# Patient Record
Sex: Female | Born: 1965 | Race: White | Hispanic: No | Marital: Married | Smoking: Never smoker
Health system: Southern US, Community
[De-identification: ages and names within clinical notes are randomized; demographics above are authoritative.]

## PROBLEM LIST (undated history)

## (undated) DIAGNOSIS — F329 Major depressive disorder, single episode, unspecified: Secondary | ICD-10-CM

## (undated) DIAGNOSIS — N938 Other specified abnormal uterine and vaginal bleeding: Secondary | ICD-10-CM

## (undated) DIAGNOSIS — T7840XA Allergy, unspecified, initial encounter: Secondary | ICD-10-CM

## (undated) DIAGNOSIS — F419 Anxiety disorder, unspecified: Secondary | ICD-10-CM

## (undated) DIAGNOSIS — F32A Depression, unspecified: Secondary | ICD-10-CM

## (undated) DIAGNOSIS — E785 Hyperlipidemia, unspecified: Secondary | ICD-10-CM

## (undated) HISTORY — DX: Other specified abnormal uterine and vaginal bleeding: N93.8

## (undated) HISTORY — DX: Major depressive disorder, single episode, unspecified: F32.9

## (undated) HISTORY — PX: TUBAL LIGATION: SHX77

## (undated) HISTORY — DX: Anxiety disorder, unspecified: F41.9

## (undated) HISTORY — PX: NASAL SINUS SURGERY: SHX719

## (undated) HISTORY — PX: DILATION AND CURETTAGE OF UTERUS: SHX78

## (undated) HISTORY — DX: Hyperlipidemia, unspecified: E78.5

## (undated) HISTORY — DX: Depression, unspecified: F32.A

## (undated) HISTORY — PX: RHINOPLASTY: SUR1284

## (undated) HISTORY — DX: Allergy, unspecified, initial encounter: T78.40XA

## (undated) HISTORY — PX: HYSTEROSCOPY: SHX211

---

## 1997-05-01 ENCOUNTER — Other Ambulatory Visit: Admission: RE | Admit: 1997-05-01 | Discharge: 1997-05-01 | Payer: Self-pay | Admitting: Gynecology

## 1997-05-06 ENCOUNTER — Encounter: Admission: RE | Admit: 1997-05-06 | Discharge: 1997-08-04 | Payer: Self-pay | Admitting: Gynecology

## 1997-10-15 ENCOUNTER — Inpatient Hospital Stay (HOSPITAL_COMMUNITY): Admission: AD | Admit: 1997-10-15 | Discharge: 1997-10-15 | Payer: Self-pay | Admitting: Gynecology

## 1997-10-28 ENCOUNTER — Inpatient Hospital Stay (HOSPITAL_COMMUNITY): Admission: AD | Admit: 1997-10-28 | Discharge: 1997-10-28 | Payer: Self-pay | Admitting: Gynecology

## 1997-10-29 ENCOUNTER — Inpatient Hospital Stay (HOSPITAL_COMMUNITY): Admission: AD | Admit: 1997-10-29 | Discharge: 1997-10-29 | Payer: Self-pay | Admitting: Obstetrics and Gynecology

## 1997-10-30 ENCOUNTER — Inpatient Hospital Stay (HOSPITAL_COMMUNITY): Admission: AD | Admit: 1997-10-30 | Discharge: 1997-10-30 | Payer: Self-pay | Admitting: Gynecology

## 1997-11-06 ENCOUNTER — Observation Stay (HOSPITAL_COMMUNITY): Admission: AD | Admit: 1997-11-06 | Discharge: 1997-11-07 | Payer: Self-pay | Admitting: Gynecology

## 1997-11-06 ENCOUNTER — Encounter: Payer: Self-pay | Admitting: Gynecology

## 1997-11-08 ENCOUNTER — Inpatient Hospital Stay (HOSPITAL_COMMUNITY): Admission: AD | Admit: 1997-11-08 | Discharge: 1997-11-08 | Payer: Self-pay | Admitting: Obstetrics and Gynecology

## 1997-11-10 ENCOUNTER — Inpatient Hospital Stay (HOSPITAL_COMMUNITY): Admission: AD | Admit: 1997-11-10 | Discharge: 1997-11-10 | Payer: Self-pay | Admitting: Obstetrics and Gynecology

## 1997-11-29 ENCOUNTER — Inpatient Hospital Stay (HOSPITAL_COMMUNITY): Admission: AD | Admit: 1997-11-29 | Discharge: 1997-11-29 | Payer: Self-pay | Admitting: Obstetrics and Gynecology

## 1997-12-03 ENCOUNTER — Inpatient Hospital Stay (HOSPITAL_COMMUNITY): Admission: AD | Admit: 1997-12-03 | Discharge: 1997-12-06 | Payer: Self-pay | Admitting: Gynecology

## 1998-01-05 ENCOUNTER — Other Ambulatory Visit: Admission: RE | Admit: 1998-01-05 | Discharge: 1998-01-05 | Payer: Self-pay | Admitting: Gynecology

## 2002-12-30 ENCOUNTER — Other Ambulatory Visit: Admission: RE | Admit: 2002-12-30 | Discharge: 2002-12-30 | Payer: Self-pay | Admitting: Gynecology

## 2002-12-31 ENCOUNTER — Encounter: Admission: RE | Admit: 2002-12-31 | Discharge: 2002-12-31 | Payer: Self-pay | Admitting: Gynecology

## 2005-08-22 ENCOUNTER — Other Ambulatory Visit: Admission: RE | Admit: 2005-08-22 | Discharge: 2005-08-22 | Payer: Self-pay | Admitting: Gynecology

## 2005-10-16 ENCOUNTER — Ambulatory Visit (HOSPITAL_COMMUNITY): Admission: RE | Admit: 2005-10-16 | Discharge: 2005-10-16 | Payer: Self-pay | Admitting: Gynecology

## 2005-10-16 ENCOUNTER — Encounter (INDEPENDENT_AMBULATORY_CARE_PROVIDER_SITE_OTHER): Payer: Self-pay | Admitting: *Deleted

## 2006-01-23 HISTORY — PX: ABDOMINAL HYSTERECTOMY: SHX81

## 2006-10-08 ENCOUNTER — Other Ambulatory Visit: Admission: RE | Admit: 2006-10-08 | Discharge: 2006-10-08 | Payer: Self-pay | Admitting: Gynecology

## 2006-10-11 ENCOUNTER — Inpatient Hospital Stay (HOSPITAL_COMMUNITY): Admission: AD | Admit: 2006-10-11 | Discharge: 2006-10-14 | Payer: Self-pay | Admitting: Gynecology

## 2006-10-11 ENCOUNTER — Other Ambulatory Visit: Payer: Self-pay | Admitting: Gynecology

## 2006-10-11 ENCOUNTER — Encounter: Payer: Self-pay | Admitting: Gynecology

## 2010-06-07 NOTE — H&P (Signed)
NAMESHIRON, WHETSEL               ACCOUNT NO.:  [id number]   MEDICAL RECORD NO.:  [id number]        PATIENT TYPE:  HAMB   LOCATION:                               FACILITY:  [facility]   PHYSICIAN:  Juan H. Lily Peer, M.D.DATE OF BIRTH:  04-18-1965   DATE OF ADMISSION:  10/11/2006  DATE OF DISCHARGE:                              HISTORY & PHYSICAL   Patient is scheduled for surgery Thursday, September 18, at 7:30 a.m. in  [facility].   CHIEF COMPLAINT:  Dysfunctional uterine bleeding and pelvic pain and  fibroid uterus.   HISTORY:  The patient was seen in the office for a preoperative  consultation and overdue gynecological examination on October 08, 2006.  She is a 45 year old gravida 2, para 2, who had been complaining  of menometrorrhagia, tiredness and fatigue.  She had been seen in the  office on August 29, 2006, as well in August __________  2008,  respectively.  In the past, because of menorrhagia, she has had NovaSure  endometrial ablation.  As part of her recent workup, a TSH and prolactin  have been drawn because of her dysfunctional uterine bleeding.  Her  prolactin level was elevated at 40.8, although she denied any nipple  discharge, visual disturbances or any unusual headache.  Her CBC was  otherwise normal.  We attempted to do a sonohysterogram, but had  difficulty passing the catheter in the upper portion of the uterine  cavity.  A band or synechia was evident.  She did have a small fibroid,  measuring 16 by 11 mm, and the ovaries appeared to be normal.  Because  of her chronic nature of bloating sensation and pelvic pain, especially  before her menses, she is scheduled to undergo laparoscopically-assisted  vaginal hysterectomy with ovarian conservation.   PAST MEDICAL HISTORY:  Patient denies any allergies.  She has had two  previous cesarean sections.   MEDICATIONS:  1. She takes Excedrin p.r.n.  2. She takes Parlodel 2.5 mg twice daily for  recently diagnosed      hyperprolactinemia.  Recent prolactin level pending at time of this      dictation.  3. She also takes Wellbutrin and Effexor for depression and anxiety.   PAST SURGICAL HISTORY:  In addition to the two cesarean sections, she  has had history of rhinoplasty and tubal ligation.  She also has had a  NovaSure endometrial ablation and D and C and hysteroscopy in September  2007.   FAMILY HISTORY:  Aunt with diabetes.  Maternal grandmother and aunt with  hypertension and grandparents and uncle with cardiovascular disease.   PHYSICAL EXAMINATION:  The patient weighs approximately 139 pounds.  HEENT:  Unremarkable.  NECK:  Supple, trachea midline.  No carotid bruits, no thyromegaly.  LUNGS:  Clear to auscultation without any rhonchi or wheezes.  HEART:  Regular rate and rhythm, no murmurs or gallops.  BREAST EXAM:  Both breasts are symmetrical in appearance.  No skin  discoloration.  No nipple inversion.  No palpable masses or tenderness.  No supraclavicular or axillary lymphadenopathy.  ABDOMEN:  Soft and  nontender, no rebound or guarding.  Pfannenstiel scar  was evident.  PELVIC EXAM:  Bartholin, urethra, Skene glands were within normal  limits.  Cervix appeared to be somewhat short.  Bimanual examination  demonstrated anteverted uterus, no palpable adnexal masses or  tenderness.  RECTAL EXAM:  Deferred.   ASSESSMENT:  Patient is a 45 year old gravida 2, para 2, with  hyperprolactinemia on Parlodel 2.5 mg daily.  Recent prolactin level  pending.  Patient is scheduled for laparoscopically-assisted vaginal  hysterectomy with ovarian conservation October 11, 2006, at [facility] as a result of dysfunctional uterine bleeding, low  pelvic pain and small fibroid uterus.  The patient's Pap smear is  pending at time of this dictation.  Patient was counseled that potential  risks of the operation are as follows.  She is at increased risk for  deep  venous thrombosis and pulmonary embolism.  She will have PSA  stockings for prophylaxis.  Also, for infection she will receive  intravenous antibiotics for prophylaxis, as well.  In the event that  patient would have any uncontrollable hemorrhage, she is fully aware of  potential complications from blood transfusion and donor-to-recipient,  such as anaphylactic reaction, hepatitis and AIDS.  In the event of any  technical difficulty, proceeding with the operation laparoscopically, or  any internal bleeding or injury, the scope will be pulled out and open  laparotomy technique may need to be performed in an effort to complete  the operation.  The goal is to leave both ovaries, unless gross  pathology deems necessary to be removed and, if so, patient would need  to be on hormone replacement therapy.  All these issues were discussed  with the patient, as well as potential risk or trauma to the bladder,  intestines or other internal structures.  All questions were answered  and will follow accordingly.   PLAN:  Patient is scheduled for laparoscopically-assisted vaginal  hysterectomy with ovarian conservation Thursday, September 18, at 7:30  a.m. in [facility].      Juan H. Lily Peer, M.D.  Electronically Signed     JHF/MEDQ  D:  10/10/2006  T:  10/10/2006  Job:  [id number]

## 2010-06-07 NOTE — Discharge Summary (Signed)
Pamela Forbes, Pamela Forbes               ACCOUNT NO.:  [id number]   MEDICAL RECORD NO.:  [id number]          PATIENT TYPE:  INP   LOCATION:  1528                         FACILITY:  [facility]   PHYSICIAN:  Juan H. Lily Peer, M.D.DATE OF BIRTH:  03/07/1965   DATE OF ADMISSION:  10/11/2006  DATE OF DISCHARGE:  10/14/2006                               DISCHARGE SUMMARY   TOTAL DAYS HOSPITALIZED:  Three days.   HISTORY:  The patient is a 45 year old gravida 2, para 2 with  dysfunctional uterine bleeding, fibroid uterus.  The patient was also  complaining of dysmenorrhea and chronic pelvic pain.  She was taken to  the operating room on the morning of October 11, 2006 where she  underwent attempted diagnostic laparoscopy with a plan of proceeding  with laparoscopic-assisted vaginal hysterectomy.  Due to the fact the  patient had 2 previous cesarean sections and there was extensive amount  of adhesions in the omentum with poor visualization of the uterus, the  operation was not unable to be proceeded with the laparoscopic approach,  so she ended up with an exploratory laparotomy with lysis of extensive  abdominopelvic adhesions, partial omentectomy, and TAH-RSO, whereby she  was found to have a right adnexal mass.  The right ovary had been  adhered to the posterior uterine wall, and the left ovary and tube were  normal with the exception that she had a prior tubal sterilization which  adhered to the left pelvic sidewall and hemosiderin deposits on the  peritoneal surface underlying the right ovarian mass suspicious for  endometrioma/endometriosis.  The patient had 300 mL of blood loss from  her surgery.   On her first operative day, her blood pressure was 136/98 and her T-max  was 97.  She had greater than 100 mL/hour urine output.  Her Foley  catheter and PCA pump were discontinued.  She was started on liquid diet  and p.o. pain meds and was started to ambulate.  The following morning,  the  patient had been up and ambulating, but had not passed gas.  There  was a mild soft distention and hypoactive bowel sounds, so I came by and  saw her that evening, and she had had 2 loose stools during the day.  Her abdomen was soft, but had not passed any further gas.  This morning,  on rounds, she remained afebrile.  Her abdomen was soft, nontender.  She  was starting to tolerate her regular diet.  Good bowel sounds in all  quadrants were noted, and she was ready to be discharged home.   FINAL DIAGNOSES:  1. Dysmenorrhea.  2. Menorrhagia.  3. Fibroid uterus.  4. Right adnexal mass suspicious for endometrioma.  5. Endometriosis.  6. Abdominopelvic adhesions.   PROCEDURE PERFORMED:  1. Attempted laparoscopic-assisted vaginal hysterectomy.  2. Exploratory laparotomy.  3. Extensive abdominopelvic adhesiolysis.  4. Total abdominal hysterectomy with right salpingo-oophorectomy.   FINAL DISPOSITION/FOLLOWUP:  The patient was discharged home on her  third postoperative day.  She was to receive a Dulcolax suppository  before leaving the hospital and was instructed to take magnesium citrate  this evening and also to start with MiraLax 1 tablespoon with juice  every morning for her regularity due to the fact that she has bowel  sounds on every 3-4 days which is considered a normal pattern for her.  She will return back to the office this Tuesday to have her staples  removed.  She was given a prescription for Motrin 800 mg to take one  p.o. t.i.d. p.r.n. and Lortab 5/500 to take only if in pain.  She was  instructed to ambulate.  Signs and symptoms to look out for that needs  attention before her postop visit would be fever, abdominal distention,  no passing of gas, and vomiting.  To contact the office or report to the  emergency room.  If not, we will see her on Tuesday.  Pathology report  is pending.      Juan H. Lily Peer, M.D.  Electronically Signed     JHF/MEDQ  D:  10/14/2006   T:  10/15/2006  Job:  [id number]

## 2010-06-07 NOTE — Op Note (Signed)
NAMEMARYLU, DUDENHOEFFER               ACCOUNT NO.:  [id number]   MEDICAL RECORD NO.:  [id number]          PATIENT TYPE:  AMB   LOCATION:  [facility]                         FACILITY:  [facility]   PHYSICIAN:  Juan H. Lily Peer, M.D.DATE OF BIRTH:  [birth year]   DATE OF PROCEDURE:  10/11/2006  DATE OF DISCHARGE:                               OPERATIVE REPORT   SURGEON:  Ok Edwards, M.D.   FIRST ASSISTANT:  Daniel L. Eda Paschal, M.D.   INDICATIONS FOR OPERATION:  A 45 year old gravida 2, para 2 with  menometrorrhagia, tiredness, fatigue, leiomyomatous uteri, and pelvic  pain.   PREOPERATIVE DIAGNOSES:  1. Dysmenorrhea.  2. Menometrorrhagia.  3. Pelvic pain.  4. Fibroids.   POSTOPERATIVE DIAGNOSES:  1. Dysmenorrhea.  2. Menorrhagia.  3. Chronic pelvic pain.  4. Fibroids.  5. Abdominal pelvic adhesions.  6. Right adnexal mass/ovarian.   ANESTHESIA:  General endotracheal anesthesia.   PROCEDURES PERFORMED:  1. Laparoscopy.  2. Laparotomy.  3. Lysis of abdominal pelvic adhesions.  4. Partial omentectomy.  5. Total abdominal hysterectomy with right salpingo-oophorectomy.   FINDINGS:  1. Extensive abdominal pelvic adhesions.  Omentum adhered to the      anterior abdominal wall.  2. A 3 x 4 cm right adnexal mass adhered to the posterior aspect of      the uterus with hemosiderin deposits on the peritoneum suspicious      for endometrioma, contralateral ovary normal with evidence of      previous tubal sterilization procedure, and the left ovary was      found to be also adhered to left pelvic sidewall, but was normal in      appearance.   DESCRIPTION OF OPERATION:  After the patient was adequately counseled,  she was taken to the operating room where she underwent successful  general endotracheal anesthesia.  She received a gram of cefoxitin  prophylactically and she had also had PAS stockings for DVT prophylaxis.  She was placed in the low lithotomy position.  The abdomen,  vagina, and  perineum were prepped and draped in usual sterile fashion.  Bimanual  examination demonstrated anteverted uterus with questionable fullness in  the right adnexa.  Hulka tenaculum was placed and a Foley catheter also  had been placed as well to monitor urinary output.  After the drapes  were in place, a small stab incision was made under the umbilicus  followed by insertion of the OptiVu trocar under laparoscopic guidance.  Once the peritoneum was entered, the pneumo insufflation was started.  Approximately 3.5 liters of carbon dioxide were insufflated into the  abdominal cavity.  Two additional 5-mm port were made in the lower  abdomen on the right and the left under laparoscopic guidance.  It was  noted at this time that extensive amount of omentum was adhered to the  anterior abdominal wall and lower abdomen as well making it difficult to  visualize completely the uterus, tubes, and ovary.  At this time, it was  noted that there was right adnexal mass adhered to the posterior uterus.  It was evident that the operation was not  able to completed  laparoscopically, so the instruments were removed in preparation for an  abdominal exploration.  The midline umbilicus fascia was closed with a  figure-of-eight of 0 Vicryl suture and subcutaneous tissue was  reapproximated with 3-0 Vicryl suture.  The left 5-mm port site was  closed with a staple and the right with 3-0 Vicryl suture interrupted  due to the fact that there was some mild oozing from the right port.   After this, a Pfannenstiel skin incision was made adjacent to the  previous Pfannenstiel scar from her previous C-section.  Incision was  carried down from the skin, subcutaneous tissue down to the rectus  fascia where a midline nick was made.  The fascia was incised in  transverse fashion.  The peritoneal cavity was entered cautiously.  Meticulous dissection was required due to the fact that omentum was  adhered to the  undersurface of the peritoneum and a partial omentectomy  was accomplished by removing segments of the omentum and free tie.  After excising it, the remaining segments of omentum were secured with 0  Vicryl suture.  The O'Connor-O'Sullivan retractors were then placed.  The patient was placed in steep Trendelenburg position and it was noted  that the right ovary measured approximately 4.5 cm in size, was adhered  to the posterior uterine surface and hemosiderin deposits were noted,  probably an endometrioma.  The left ovary and tube were slightly adhered  to the pelvic sidewall, but were able to be meticulously freed after  dissection.  Two Heaney clamps were placed adjacent to the right and  left utero-ovarian ligaments.  The ureters were identified.  The  posterior broad ligament was penetrated with a surgeon's finger and the  right infundibulopelvic ligament was clamped, cut, and free tied with 0  Vicryl suture followed by transfixion stitch.  The round ligament had  previously been transected and the bladder was peeled down to the level  of the lower uterine segment.  With the use of straight Heaney, the  parametrial tissue to include the cardinal and subsequently the broad  ligaments were clamped, cut, and suture ligated with 0 Vicryl suture to  the level of right vaginal fornix.  On the left side, the ovary appeared  normal.  The round ligament was transected and a Heaney clamp was  utilized to penetrate the broad ligament and the Heaney clamp was placed  close to the uterus and the utero-ovarian ligament was transected and  the pedicle was free tied with 0 Vicryl suture followed by transfixion  stitch, thus leaving the left tube and ovary behind.  The remainder of  the parametrium to include the cardinal and broad ligament were clamped,  cut, and suture ligated with 0 Vicryl suture to the level of the left  vaginal fornix.  With curved Heaney, the cervicovaginal region was  clamped,  cut, and the cervix and uterus were passed off the operative  field with the right tube and ovary.  The angles were secured with 0  Vicryl suture and the remaining cuff was secured with interrupted  sutures of 0 Vicryl suture.  Systematic inspection of the entire pelvic  cavity containing some of the small oozing was contained with the Bovie.  After copious irrigation, the vaginal cuff was inspected.  No active  bleeding.  Surgicel was placed for additional hemostasis.  Inspection of  the areas with a previous omentectomy demonstrated there was complete  dryness.  No other bleeding was noted as both  pedicles looked dry.  The  sponge count and needle count were correct.  The O'Connor-O'Sullivan  retractor was removed.  The visceral peritoneum was not reapproximated,  but the rectus fascia was closed with running stitch of 0 Vicryl suture.  The subcutaneous bleeder were Bovie cauterized.  The skin was  reapproximated with skin clips followed by placing Xeroform gauze and 4x  dressing.  A  0.25% Marcaine was infiltrated in all port sites and Xeroform gauze and  4x dressing was then placed.  The patient was awakened and transferred  to recovery room with stable vital signs.  Blood loss from procedure was  300 mL.  IV fluids 2300 mL of lactated Ringer's and urine output 150 mL  and clear.      Juan H. Lily Peer, M.D.  Electronically Signed     JHF/MEDQ  D:  10/11/2006  T:  10/11/2006  Job:  [id number]

## 2010-06-10 NOTE — Op Note (Signed)
[name], LIST               ACCOUNT NO.:  [id number]   MEDICAL RECORD NO.:  [id number]          PATIENT TYPE:  AMB   LOCATION:  [facility]                           FACILITY:  [facility]   PHYSICIAN:  Ivor Costa. Farrel Gobble, M.D. DATE OF BIRTH:  1965/12/25   DATE OF PROCEDURE:  10/16/2005  DATE OF DISCHARGE:  10/16/2005                                 OPERATIVE REPORT   PREOPERATIVE DIAGNOSES:  Dysfunctional bleeding with intrauterine device.   POSTOPERATIVE DIAGNOSES:  Dysfunctional bleeding with intrauterine device.   PROCEDURE:  Dilatation and curettage, hysteroscopy, NovaSure endometrial  ablation, intrauterine device removal.   SURGEON:  Ivor Costa. Farrel Gobble, M.D.   ANESTHESIA:  MAC with paracervical block, 10 mL of 0.25% Marcaine.   FLUIDS:  I&O deficit of lactated Ringer's was 100 mL.   ESTIMATED BLOOD LOSS:  Minimal.   FINDINGS:  Anteflexed uterus sounding to 8 cm.  The cervix was four.  The  width was 2.2, smooth cavity, normal ostia.   PROCEDURE:  The patient was taken to the operating room.  General anesthesia  was induced, placed in dorsal lithotomy position, prepped and draped in the  usual sterile fashion.  A bimanual exam was performed.  Normal orientation  of the uterus was confirmed.  A sterile weighted speculum was placed in the  vagina.  The cervix was markedly on the anterior vaginal wall however the  IUD strings were able to be seen and grasped and removed.  The cervix was  sounded repeatedly at 4 cm.  The orientation of the uterus made it difficult  to sound the cavity length.  The speculum was then removed.  The cervical  length was confirmed at four and the uterus sounded to 8.  Diagnostic  hysteroscope was then advanced through the cervix towards the fundus.  The  cavity was smooth without any defects.  The ostia were visualized.  The  cavity was noted to be narrow.  Gentle curettage was then performed.  The  NovaSure was set to the specifications and advanced through  the cervix.  The  cervix did require dilation to 21-French after which point the NovaSure was  felt to slide through the cervical os and into the uterine cavity.  It was  opened and despite the usual maneuvers the cavity width remained at 2.2.  The NovaSure was removed and the cavity length was confirmed against the  button as being 8 cm.  The NovaSure was placed back into the cavity.  The  fundus was tapped and again going through the usual maneuvers the cavity  failed to open greater than 2.2.  The NovaSure again was removed.  The  length of the cavity sound was confirmed.  The diagnostic hysteroscope was  advanced through the cervix towards the fundus which did show again  confirmation that the cavity was intact.  The NovaSure again was placed into  the cavity, opened up again only to 2.2, again going through the usual  maneuvers.  We elected at this point as we felt comfortable that we were  against the fundus to proceed with the ablation.  The  CO2 test was passed.  The wattage was 48 and the ablation occurred in a time of 1 minute 48  seconds.  The  hysteroscope was then advanced back through the cervix towards the fundus  and the ablated versus non-ablated tissue was clearly identified.  The  patient tolerated procedure well.  Sponge, lap and needle counts correct x2.  She was transferred to the PACU in stable condition.      Ivor Costa. Farrel Gobble, M.D.  Electronically Signed     THL/MEDQ  D:  10/16/2005  T:  10/18/2005  Job:  [id number]

## 2010-11-03 LAB — CBC
HCT: 29.4 — ABNORMAL LOW
Hemoglobin: 10.3 — ABNORMAL LOW
MCHC: 34.8
MCV: 83.2
Platelets: 322
RBC: 3.54 — ABNORMAL LOW
RDW: 13.5
WBC: 14.9 — ABNORMAL HIGH

## 2010-11-03 LAB — HEMOGLOBIN AND HEMATOCRIT, BLOOD
HCT: 32.8 — ABNORMAL LOW
Hemoglobin: 11.5 — ABNORMAL LOW

## 2010-11-10 ENCOUNTER — Encounter: Payer: Self-pay | Admitting: Gynecology

## 2010-11-11 ENCOUNTER — Ambulatory Visit (INDEPENDENT_AMBULATORY_CARE_PROVIDER_SITE_OTHER): Payer: BC Managed Care – PPO | Admitting: Gynecology

## 2010-11-11 ENCOUNTER — Encounter: Payer: Self-pay | Admitting: Gynecology

## 2010-11-11 VITALS — BP 116/70 | Ht 63.75 in | Wt 158.0 lb

## 2010-11-11 DIAGNOSIS — N949 Unspecified condition associated with female genital organs and menstrual cycle: Secondary | ICD-10-CM

## 2010-11-11 DIAGNOSIS — N951 Menopausal and female climacteric states: Secondary | ICD-10-CM

## 2010-11-11 DIAGNOSIS — R102 Pelvic and perineal pain: Secondary | ICD-10-CM

## 2010-11-11 DIAGNOSIS — R823 Hemoglobinuria: Secondary | ICD-10-CM

## 2010-11-11 MED ORDER — IBUPROFEN 800 MG PO TABS: 800.0000 mg | ORAL_TABLET | Freq: Three times a day (TID) | ORAL | Status: AC | PRN

## 2010-11-11 NOTE — Progress Notes (Signed)
Patient presents having not been seen in the office for number of years.  History of total abdominal hysterectomy right salpingo-oophorectomy for dysfunctional bleeding leiomyoma in 2008. She has a last several months some hot flushes night sweats and most acutely over the last 2 days left lower quadrant pain. It was acute onset during the evening was associated with sweating has somewhat eased off to a dull ache at present. She had no nausea vomiting diarrhea constipation no dysuria frequency or other UTI symptoms.  No fever chills or other constitutional symptoms.  No history of similar pain before.  Exam Spine straight with no CVA tenderness Abdomen soft mild left lower quadrant discomfort no rebound guarding masses or organomegaly Pelvic external BUS vagina normal, bimanual without masses or tenderness, rectovaginal exam is normal  Assessment and plan: 1. Hot flushes night sweats of the last several months. We'll check baseline TSH FSH and will follow up her results. 2. Left pelvic pain. Relatively acute in onset now better but still present. Discussed differential with the patient to include ovarian process such as cyst, possible adhesive disease noting that during her surgery there was an fair amount of abdominal adhesions, GI such as diverticulitis, urinary such as stone. History and exam points to most likely to an ovarian cyst that probably has ruptured and is resolving. We'll start with CBC for baseline white count, urinalysis rule out UTI check for blood to suggest stone. We'll schedule ultrasound rule out ovarian process.  I prescribed ibuprofen 800 mg #30 one by mouth every 8 hour when necessary pain.   Patient will follow for results and we'll go from there. She has not had a checkup and a number of years and I stressed the importance of to do so. She also notes she is way overdue for mammogram and knows that she needs to schedule this also.

## 2010-11-18 ENCOUNTER — Ambulatory Visit (INDEPENDENT_AMBULATORY_CARE_PROVIDER_SITE_OTHER): Payer: BC Managed Care – PPO | Admitting: Gynecology

## 2010-11-18 ENCOUNTER — Ambulatory Visit (INDEPENDENT_AMBULATORY_CARE_PROVIDER_SITE_OTHER): Payer: BC Managed Care – PPO

## 2010-11-18 ENCOUNTER — Encounter: Payer: Self-pay | Admitting: Gynecology

## 2010-11-18 VITALS — BP 122/74

## 2010-11-18 DIAGNOSIS — R102 Pelvic and perineal pain: Secondary | ICD-10-CM

## 2010-11-18 DIAGNOSIS — N949 Unspecified condition associated with female genital organs and menstrual cycle: Secondary | ICD-10-CM

## 2010-11-18 NOTE — Progress Notes (Signed)
Patient follows up for her ultrasound due to pelvic pain. Her lab work showed normal TSH FSH normal white count negative urine culture. Ultrasound today is normal showing left ovary with physiologic changes. Her right adnexa is negative she is status post RSO. She notes that her pain is better although she is still having some bloating symptoms and some diarrhea. I reviewed with her I think the etiology of her pain and symptoms is GI such as irritable bowel / spastic colon. I doubt diverticulitis given her young age and normal white count. Patient will increase fiber in her diet monitor her symptoms over the next week if her diarrhea clears and her abdominal bloating resolves and will follow with her abdominal symptoms continue she is to call me and we'll arrange for her to be seen by gastroenterologist.

## 2012-08-18 ENCOUNTER — Emergency Department (HOSPITAL_COMMUNITY)
Admission: EM | Admit: 2012-08-18 | Discharge: 2012-08-18 | Disposition: A | Discharge status: Home / Self Care | Payer: BC Managed Care – PPO | Attending: Emergency Medicine | Admitting: Emergency Medicine

## 2012-08-18 ENCOUNTER — Encounter (HOSPITAL_COMMUNITY): Payer: Self-pay | Admitting: *Deleted

## 2012-08-18 ENCOUNTER — Ambulatory Visit (INDEPENDENT_AMBULATORY_CARE_PROVIDER_SITE_OTHER): Payer: BC Managed Care – PPO | Admitting: Emergency Medicine

## 2012-08-18 ENCOUNTER — Emergency Department (HOSPITAL_COMMUNITY): Payer: BC Managed Care – PPO

## 2012-08-18 VITALS — BP 130/74 | HR 74 | Temp 98.2°F | Resp 16 | Ht 64.0 in | Wt 159.2 lb

## 2012-08-18 DIAGNOSIS — R110 Nausea: Secondary | ICD-10-CM | POA: Insufficient documentation

## 2012-08-18 DIAGNOSIS — R200 Anesthesia of skin: Secondary | ICD-10-CM

## 2012-08-18 DIAGNOSIS — F3289 Other specified depressive episodes: Secondary | ICD-10-CM | POA: Insufficient documentation

## 2012-08-18 DIAGNOSIS — R209 Unspecified disturbances of skin sensation: Secondary | ICD-10-CM | POA: Insufficient documentation

## 2012-08-18 DIAGNOSIS — Z79899 Other long term (current) drug therapy: Secondary | ICD-10-CM | POA: Insufficient documentation

## 2012-08-18 DIAGNOSIS — F329 Major depressive disorder, single episode, unspecified: Secondary | ICD-10-CM | POA: Insufficient documentation

## 2012-08-18 DIAGNOSIS — Z8742 Personal history of other diseases of the female genital tract: Secondary | ICD-10-CM | POA: Insufficient documentation

## 2012-08-18 DIAGNOSIS — R202 Paresthesia of skin: Secondary | ICD-10-CM

## 2012-08-18 DIAGNOSIS — M79609 Pain in unspecified limb: Secondary | ICD-10-CM | POA: Insufficient documentation

## 2012-08-18 DIAGNOSIS — R079 Chest pain, unspecified: Secondary | ICD-10-CM

## 2012-08-18 DIAGNOSIS — F411 Generalized anxiety disorder: Secondary | ICD-10-CM | POA: Insufficient documentation

## 2012-08-18 DIAGNOSIS — R0789 Other chest pain: Secondary | ICD-10-CM | POA: Insufficient documentation

## 2012-08-18 DIAGNOSIS — Z8589 Personal history of malignant neoplasm of other organs and systems: Secondary | ICD-10-CM | POA: Insufficient documentation

## 2012-08-18 DIAGNOSIS — R5381 Other malaise: Secondary | ICD-10-CM | POA: Insufficient documentation

## 2012-08-18 DIAGNOSIS — R0602 Shortness of breath: Secondary | ICD-10-CM | POA: Insufficient documentation

## 2012-08-18 LAB — CBC WITH DIFFERENTIAL/PLATELET
Basophils Absolute: 0 10*3/uL (ref 0.0–0.1)
Basophils Relative: 0 % (ref 0–1)
Eosinophils Absolute: 0.1 10*3/uL (ref 0.0–0.7)
Eosinophils Relative: 1 % (ref 0–5)
HCT: 40.3 % (ref 36.0–46.0)
Hemoglobin: 13.3 g/dL (ref 12.0–15.0)
Lymphocytes Relative: 32 % (ref 12–46)
Lymphs Abs: 3.1 10*3/uL (ref 0.7–4.0)
MCH: 28.2 pg (ref 26.0–34.0)
MCHC: 33 g/dL (ref 30.0–36.0)
MCV: 85.6 fL (ref 78.0–100.0)
Monocytes Absolute: 0.6 10*3/uL (ref 0.1–1.0)
Monocytes Relative: 6 % (ref 3–12)
Neutro Abs: 6 10*3/uL (ref 1.7–7.7)
Neutrophils Relative %: 61 % (ref 43–77)
Platelets: 344 10*3/uL (ref 150–400)
RBC: 4.71 MIL/uL (ref 3.87–5.11)
RDW: 13.7 % (ref 11.5–15.5)
WBC: 9.8 10*3/uL (ref 4.0–10.5)

## 2012-08-18 LAB — D-DIMER, QUANTITATIVE: D-Dimer, Quant: <0.27 ug/mL-FEU (ref 0.00–0.48)

## 2012-08-18 LAB — BASIC METABOLIC PANEL
BUN: 14 mg/dL (ref 6–23)
CO2: 23 mEq/L (ref 19–32)
Calcium: 9.1 mg/dL (ref 8.4–10.5)
Chloride: 103 mEq/L (ref 96–112)
Creatinine, Ser: 0.73 mg/dL (ref 0.50–1.10)
GFR calc Af Amer: >90 mL/min (ref >90)
GFR calc non Af Amer: >90 mL/min (ref >90)
Glucose, Bld: 85 mg/dL (ref 70–99)
Potassium: 4.3 mEq/L (ref 3.5–5.1)
Sodium: 136 mEq/L (ref 135–145)

## 2012-08-18 LAB — POCT I-STAT TROPONIN I
Troponin i, poc: 0.03 ng/mL (ref 0.00–0.08)
Troponin i, poc: 0.01 ng/mL (ref 0.00–0.08)

## 2012-08-18 LAB — PRO B NATRIURETIC PEPTIDE: Pro B Natriuretic peptide (BNP): 36.7 pg/mL (ref 0–125)

## 2012-08-18 MED ORDER — PREDNISONE 20 MG PO TABS: ORAL_TABLET | ORAL | Status: DC

## 2012-08-18 MED ORDER — ONDANSETRON 8 MG PO TBDP
8.0000 mg | ORAL_TABLET | Freq: Once | ORAL | Status: AC
  Administered 2012-08-18: 8 mg via ORAL
  Filled 2012-08-18: qty 1

## 2012-08-18 NOTE — ED Provider Notes (Signed)
CSN: [id number]     Arrival date & time 08/18/12  0940 History     First MD Initiated Contact with Patient 08/18/12 1014     Chief Complaint  Patient presents with  . Arm Pain  . Nausea  . Chest Pain   (Consider location/radiation/quality/duration/timing/severity/associated sxs/prior Treatment) HPI Comments: Patient is a 47 year old female with history of anxiety, depression who presents today after seeing her family doctor for evaluation of nausea, weakness, and chest pressure since 5am. The chest pressure was associated with left arm numbness and nausea. She reports there was a "catchy" pain in her chest. She has mild shortness of breath which is improved with sitting up. This has been going on for the past few weeks. She additionally had a similar episode on Friday as well. Currently she states she feels improved, just mildly nauseous and fatigued. No headaches while symptoms were occuring. She has a significant family history of heart disease. Her grandfather and uncles all died of MIs in their early 65s.   The history is provided by the patient. No language interpreter was used.    Past Medical History  Diagnosis Date  . Anxiety   . Depression   . DUB (dysfunctional uterine bleeding)   . Dermoid cyst    Past Surgical History  Procedure Laterality Date  . Cesarean section      X 2  . Nasal sinus surgery    . Rhinoplasty    . Tubal ligation    . Dilation and curettage of uterus    . Hysteroscopy    . Abdominal hysterectomy  2008    TAH-RSO   Family History  Problem Relation Age of Onset  . Hypertension Mother   . Heart disease Brother   . Dementia Brother   . Diabetes Maternal Aunt   . Heart disease Maternal Aunt   . Diabetes Maternal Grandmother   . Heart disease Maternal Grandmother   . Heart disease Maternal Grandfather   . Cancer Father    History  Substance Use Topics  . Smoking status: Never Smoker   . Smokeless tobacco: Not on file  . Alcohol Use: Yes      Comment: rare   OB History   Grav Para Term Preterm Abortions TAB SAB Ect Mult Living   2 2 2       2      Review of Systems  Constitutional: Negative for fever and chills.  Respiratory: Positive for shortness of breath.   Cardiovascular: Positive for chest pain.  Gastrointestinal: Positive for nausea. Negative for vomiting and abdominal pain.  Skin: Negative for rash.  Neurological: Positive for numbness.  All other systems reviewed and are negative.    Allergies  Review of patient's allergies indicates no known allergies.  Home Medications   Current Outpatient Rx  Name  Route  Sig  Dispense  Refill  . amphetamine-dextroamphetamine (ADDERALL XR) 30 MG 24 hr capsule   Oral   Take 30 mg by mouth every morning.         . Aspirin-Acetaminophen-Caffeine (EXCEDRIN PO)   Oral   Take by mouth.           . bromocriptine (PARLODEL) 2.5 MG tablet   Oral   Take 2.5 mg by mouth 2 (two) times daily.           Marland Kitchen buPROPion (WELLBUTRIN XL) 150 MG 24 hr tablet   Oral   Take 150 mg by mouth daily.           Marland Kitchen  Venlafaxine HCl (EFFEXOR PO)   Oral   Take by mouth.            BP 131/65  Pulse 80  Temp(Src) 97.1 F (36.2 C) (Oral)  Resp 17  SpO2 98% Physical Exam  Nursing note and vitals reviewed. Constitutional: She is oriented to person, place, and time. She appears well-developed and well-nourished. No distress.  HENT:  Head: Normocephalic and atraumatic.  Right Ear: External ear normal.  Left Ear: External ear normal.  Nose: Nose normal.  Mouth/Throat: Oropharynx is clear and moist.  Eyes: Conjunctivae are normal.  Neck: Normal range of motion.  Cardiovascular: Normal rate, regular rhythm and normal heart sounds.   Pulmonary/Chest: Effort normal and breath sounds normal. No stridor. No respiratory distress. She has no wheezes. She has no rales.  Abdominal: Soft. She exhibits no distension.  Musculoskeletal: Normal range of motion.  Neurological: She is alert  and oriented to person, place, and time. She has normal strength.  Skin: Skin is warm and dry. She is not diaphoretic. No erythema.  Psychiatric: She has a normal mood and affect. Her behavior is normal.    ED Course   Procedures (including critical care time)  Labs Reviewed  CBC WITH DIFFERENTIAL  BASIC METABOLIC PANEL  PRO B NATRIURETIC PEPTIDE  D-DIMER, QUANTITATIVE  POCT I-STAT TROPONIN I  POCT I-STAT TROPONIN I   11:15 AM Patient reevaluated. She feels better after Zofran. Updated labs pending. During time when I was in room, arm became heavy again. Strength normal.    Date: 08/18/2012  Rate: 82  Rhythm: normal sinus rhythm  QRS Axis: normal  Intervals: normal  ST/T Wave abnormalities: normal  Conduction Disutrbances:none  Narrative Interpretation:   Old EKG Reviewed: none available    Dg Chest 2 View  08/18/2012   *RADIOLOGY REPORT*  Clinical Data: Anterior left chest pain with numbness and tingling in the left arm.  CHEST - 2 VIEW  Comparison: None.  Findings: The heart size and mediastinal contours are normal. There are calcified right hilar and subcarinal lymph nodes.  The lungs appear clear.  There is no pleural effusion or pneumothorax. No acute osseous findings are seen.  IMPRESSION: No acute cardiopulmonary process.  Evidence of prior granulomatous disease.   Original Report Authenticated By: Carey Bullocks, M.D.   1. Chest pain     MDM  Patient evaluated for chest pain and heaviness in her left arm. Labs including troponin x 2 and d-dimer are negative. No concern for ACS or PE at this time. No concern for TIA or stroke. Physical exam is benign. Follow up with cardiology. Discussed case with Dr. Ranae Palms who agrees with plan. Return instructions given. Vital signs stable for discharge. Patient / Family / Caregiver informed of clinical course, understand medical decision-making process, and agree with plan.     Mora Bellman, PA-C 08/18/12 2046

## 2012-08-18 NOTE — Progress Notes (Signed)
  Subjective:    Patient ID: Pamela Forbes, female    DOB: 05-13-1965, 47 y.o.   MRN: [id number]  HPI 47 y.o. Female comes into clinic with left arm numbness that's started around 5 am. Has slight pressure in chest that is better with sitting up. Has some nausea. Had [place] food last night.   Patient had another episode of this Friday morning. Has a heavy feeling and discomfort with breathing upon laying flat.  Upon waking up she felt a "catchy" pain in her chest. Does not have neck pain. Patient denies having had any weakness.  Family history of heart disease.   Review of Systems     Objective:   Physical Exam patient is alert and cooperative in no distress. Her neck is supple. Her chest is clear to auscultation and percussion. Her cardiac exam reveals a regular rate without murmurs rubs or gallops. The abdomen is soft liver and spleen not enlarged there are no areas of tenderness. Neurological exam cranial nerves II through XII are intact. The disc margins are sharp. Deep tendon reflexes of the biceps and knees are 2+ and symmetrical. Romberg testing revealed no ataxia  EKG low voltage no acute changes      Assessment & Plan:  The symptoms have occurred on 3 consecutive mornings. Patient may be a candidate for a set of cardiac enzymes. Would also consider CT imaging of the head because of her episodes of left arm weakness numbness and consider C-spine films even though her neck exam is normal. Patient sent to [facility]

## 2012-08-18 NOTE — ED Notes (Signed)
Pt reports having numbness in left arm intermittently x 2 days, currently nauseated, small amt of pressure in central chest; pt went to [facility] this am and was sent here

## 2012-08-20 NOTE — ED Provider Notes (Signed)
Medical screening examination/treatment/procedure(s) were performed by non-physician practitioner and as supervising physician I was immediately available for consultation/collaboration.   Loren Racer, MD 08/20/12 [id number]

## 2012-09-27 ENCOUNTER — Encounter: Payer: BC Managed Care – PPO | Admitting: Family Medicine

## 2013-05-30 ENCOUNTER — Encounter: Payer: BC Managed Care – PPO | Admitting: Family Medicine

## 2013-08-21 ENCOUNTER — Ambulatory Visit: Payer: BC Managed Care – PPO | Admitting: Family Medicine

## 2013-11-24 ENCOUNTER — Encounter (HOSPITAL_COMMUNITY): Payer: Self-pay | Admitting: *Deleted

## 2015-10-27 ENCOUNTER — Other Ambulatory Visit: Payer: Self-pay | Admitting: Family Medicine

## 2015-10-27 DIAGNOSIS — Z1231 Encounter for screening mammogram for malignant neoplasm of breast: Secondary | ICD-10-CM

## 2015-11-09 ENCOUNTER — Ambulatory Visit
Admission: RE | Admit: 2015-11-09 | Discharge: 2015-11-09 | Disposition: A | Discharge status: Home / Self Care | Payer: No Typology Code available for payment source | Source: Ambulatory Visit | Attending: Family Medicine | Admitting: Family Medicine

## 2015-11-09 DIAGNOSIS — Z1231 Encounter for screening mammogram for malignant neoplasm of breast: Secondary | ICD-10-CM

## 2016-11-01 ENCOUNTER — Other Ambulatory Visit: Payer: Self-pay | Admitting: Family Medicine

## 2016-11-01 DIAGNOSIS — Z1231 Encounter for screening mammogram for malignant neoplasm of breast: Secondary | ICD-10-CM

## 2016-11-15 ENCOUNTER — Ambulatory Visit
Admission: RE | Admit: 2016-11-15 | Discharge: 2016-11-15 | Disposition: A | Discharge status: Home / Self Care | Payer: No Typology Code available for payment source | Source: Ambulatory Visit | Attending: Family Medicine | Admitting: Family Medicine

## 2016-11-15 DIAGNOSIS — Z1231 Encounter for screening mammogram for malignant neoplasm of breast: Secondary | ICD-10-CM

## 2016-11-17 ENCOUNTER — Other Ambulatory Visit: Payer: Self-pay | Admitting: Family Medicine

## 2016-11-17 DIAGNOSIS — R928 Other abnormal and inconclusive findings on diagnostic imaging of breast: Secondary | ICD-10-CM

## 2016-11-21 ENCOUNTER — Ambulatory Visit
Admission: RE | Admit: 2016-11-21 | Discharge: 2016-11-21 | Disposition: A | Discharge status: Home / Self Care | Payer: No Typology Code available for payment source | Source: Ambulatory Visit | Attending: Family Medicine | Admitting: Family Medicine

## 2016-11-21 DIAGNOSIS — R928 Other abnormal and inconclusive findings on diagnostic imaging of breast: Secondary | ICD-10-CM

## 2017-08-30 ENCOUNTER — Encounter: Payer: Self-pay | Admitting: Obstetrics & Gynecology

## 2017-08-30 ENCOUNTER — Ambulatory Visit: Payer: No Typology Code available for payment source | Admitting: Obstetrics & Gynecology

## 2017-08-30 VITALS — BP 126/84 | Ht 63.5 in | Wt 179.0 lb

## 2017-08-30 DIAGNOSIS — N898 Other specified noninflammatory disorders of vagina: Secondary | ICD-10-CM

## 2017-08-30 DIAGNOSIS — Z01419 Encounter for gynecological examination (general) (routine) without abnormal findings: Secondary | ICD-10-CM | POA: Diagnosis not present

## 2017-08-30 DIAGNOSIS — N951 Menopausal and female climacteric states: Secondary | ICD-10-CM

## 2017-08-30 DIAGNOSIS — Z90710 Acquired absence of both cervix and uterus: Secondary | ICD-10-CM | POA: Diagnosis not present

## 2017-08-30 LAB — WET PREP FOR TRICH, YEAST, CLUE

## 2017-08-30 MED ORDER — ESTRADIOL 0.1 MG/24HR TD PTWK: 0.1000 mg | MEDICATED_PATCH | TRANSDERMAL | 4 refills | Status: DC

## 2017-08-30 NOTE — Progress Notes (Signed)
Pamela Forbes 10/06/1965 [id number]   History:    52 y.o. G2P2L2 Married. Oldest (transgender son) is in art in [place].  Youngest daughter is going in theatre in [place].  RP:  New patient presenting for annual gyn exam   HPI: S/P Hysterectomy/RSO.  Worsening daily hot flushes/night sweats with difficulty sleeping and skin dryness x 04/2017.  No pelvic pain.  Urine/BMs wnl.  C/O vaginal itching, irritation x a few days.  Breasts normal.  Body mass index 31.21.  Fasting health labs with family physician.  No colonoscopy done yet.  Past medical history,surgical history, family history and social history were all reviewed and documented in the EPIC chart.  Gynecologic History No LMP recorded. Patient has had a hysterectomy. Contraception: status post hysterectomy Last Pap: 2008. Results were: normal per patient Last mammogram: 10/2016. Results were: Benign Bone Density: Never Colonoscopy: Not yet, will organize through her Family MD  Obstetric History OB History  Gravida Para Term Preterm AB Living  2 2 2     2   SAB TAB Ectopic Multiple Live Births               # Outcome Date GA Lbr Len/2nd Weight Sex Delivery Anes PTL Lv  2 Term           1 Term              ROS: A ROS was performed and pertinent positives and negatives are included in the history.  GENERAL: No fevers or chills. HEENT: No change in vision, no earache, sore throat or sinus congestion. NECK: No pain or stiffness. CARDIOVASCULAR: No chest pain or pressure. No palpitations. PULMONARY: No shortness of breath, cough or wheeze. GASTROINTESTINAL: No abdominal pain, nausea, vomiting or diarrhea, melena or bright red blood per rectum. GENITOURINARY: No urinary frequency, urgency, hesitancy or dysuria. MUSCULOSKELETAL: No joint or muscle pain, no back pain, no recent trauma. DERMATOLOGIC: No rash, no itching, no lesions. ENDOCRINE: No polyuria, polydipsia, no heat or cold intolerance. No recent change in weight.  HEMATOLOGICAL: No anemia or easy bruising or bleeding. NEUROLOGIC: No headache, seizures, numbness, tingling or weakness. PSYCHIATRIC: No depression, no loss of interest in normal activity or change in sleep pattern.     Exam:   BP 126/84   Ht 5' 3.5" (1.613 m)   Wt 179 lb (81.2 kg)   BMI 31.21 kg/m   Body mass index is 31.21 kg/m.  General appearance : Well developed well nourished female. No acute distress HEENT: Eyes: no retinal hemorrhage or exudates,  Neck supple, trachea midline, no carotid bruits, no thyroidmegaly Lungs: Clear to auscultation, no rhonchi or wheezes, or rib retractions  Heart: Regular rate and rhythm, no murmurs or gallops Breast:Examined in sitting and supine position were symmetrical in appearance, no palpable masses or tenderness,  no skin retraction, no nipple inversion, no nipple discharge, no skin discoloration, no axillary or supraclavicular lymphadenopathy Abdomen: no palpable masses or tenderness, no rebound or guarding Extremities: no edema or skin discoloration or tenderness  Pelvic: Vulva: Normal             Vagina: No gross lesions or discharge.  Pap reflex on vaginal vault.  Wet prep done.  Cervix/Uterus absent  Adnexa  Without masses or tenderness  Anus: Normal  Wet prep negative   Assessment/Plan:  52 y.o. female for annual exam   1. Well woman exam with routine gynecological exam Gynecologic exam s/p Total Hysterectomy/RSO.  Pap reflex done on the  vaginal vault.  Breast exam normal.  Last screening Mammo negative 10/2016.  Will organize Colonoscopy through her Fam MD and do Fasting Labs.    2. S/P total hysterectomy  3. Menopausal syndrome S/P Total Hysterectomy.  Will start on Estradiol patch 0.1 weekly.  No contraindication to hormone replacement therapy.  Usage, benefits and risks reviewed thoroughly with patient.  Prescription sent to pharmacy.  4. Vaginal irritation Wet prep negative.  Reassured. - WET PREP FOR [place], YEAST,  CLUE  Other orders - estradiol (CLIMARA - DOSED IN MG/24 HR) 0.1 mg/24hr patch; Place 1 patch (0.1 mg total) onto the skin once a week.  Counseling on above issues and continuation of care more than 50% for 10 minutes.  Princess Bruins MD, 11:44 AM 08/30/2017

## 2017-08-31 ENCOUNTER — Encounter: Payer: Self-pay | Admitting: Obstetrics & Gynecology

## 2017-08-31 LAB — PAP IG W/ RFLX HPV ASCU

## 2017-08-31 NOTE — Patient Instructions (Signed)
1. Well woman exam with routine gynecological exam Gynecologic exam s/p Total Hysterectomy/RSO.  Pap reflex done on the vaginal vault.  Breast exam normal.  Last screening Mammo negative 10/2016.  Will organize Colonoscopy through her Fam MD and do Fasting Labs.    2. S/P total hysterectomy  3. Menopausal syndrome S/P Total Hysterectomy.  Will start on Estradiol patch 0.1 weekly.  No contraindication to hormone replacement therapy.  Usage, benefits and risks reviewed thoroughly with patient.  Prescription sent to pharmacy.  4. Vaginal irritation Wet prep negative.  Reassured. - WET PREP FOR [place], YEAST, CLUE  Other orders - estradiol (CLIMARA - DOSED IN MG/24 HR) 0.1 mg/24hr patch; Place 1 patch (0.1 mg total) onto the skin once a week.  [name], it was a pleasure meeting you today!  I will inform you of your results as soon as they are available.

## 2017-11-16 ENCOUNTER — Other Ambulatory Visit: Payer: Self-pay | Admitting: Family Medicine

## 2017-11-16 DIAGNOSIS — Z1231 Encounter for screening mammogram for malignant neoplasm of breast: Secondary | ICD-10-CM

## 2017-12-27 ENCOUNTER — Ambulatory Visit
Admission: RE | Admit: 2017-12-27 | Discharge: 2017-12-27 | Disposition: A | Discharge status: Home / Self Care | Payer: No Typology Code available for payment source | Source: Ambulatory Visit | Attending: Family Medicine | Admitting: Family Medicine

## 2017-12-27 DIAGNOSIS — Z1231 Encounter for screening mammogram for malignant neoplasm of breast: Secondary | ICD-10-CM

## 2018-02-14 ENCOUNTER — Encounter: Payer: Self-pay | Admitting: Family Medicine

## 2018-09-01 ENCOUNTER — Other Ambulatory Visit: Payer: Self-pay | Admitting: Obstetrics & Gynecology

## 2018-09-03 ENCOUNTER — Encounter: Payer: No Typology Code available for payment source | Admitting: Obstetrics & Gynecology

## 2018-09-20 ENCOUNTER — Other Ambulatory Visit: Payer: Self-pay

## 2018-09-20 DIAGNOSIS — Z20822 Contact with and (suspected) exposure to covid-19: Secondary | ICD-10-CM

## 2018-09-22 LAB — NOVEL CORONAVIRUS, NAA: SARS-CoV-2, NAA: NOT DETECTED

## 2018-11-11 ENCOUNTER — Other Ambulatory Visit: Payer: Self-pay

## 2018-11-12 ENCOUNTER — Telehealth: Payer: Self-pay | Admitting: *Deleted

## 2018-11-12 ENCOUNTER — Encounter: Payer: Self-pay | Admitting: Obstetrics & Gynecology

## 2018-11-12 ENCOUNTER — Ambulatory Visit: Payer: No Typology Code available for payment source | Admitting: Obstetrics & Gynecology

## 2018-11-12 VITALS — BP 130/80 | Ht 63.5 in | Wt 191.8 lb

## 2018-11-12 DIAGNOSIS — N6324 Unspecified lump in the left breast, lower inner quadrant: Secondary | ICD-10-CM

## 2018-11-12 DIAGNOSIS — Z79890 Hormone replacement therapy (postmenopausal): Secondary | ICD-10-CM

## 2018-11-12 DIAGNOSIS — E6609 Other obesity due to excess calories: Secondary | ICD-10-CM

## 2018-11-12 DIAGNOSIS — Z01419 Encounter for gynecological examination (general) (routine) without abnormal findings: Secondary | ICD-10-CM

## 2018-11-12 DIAGNOSIS — Z90710 Acquired absence of both cervix and uterus: Secondary | ICD-10-CM | POA: Diagnosis not present

## 2018-11-12 DIAGNOSIS — Z6833 Body mass index (BMI) 33.0-33.9, adult: Secondary | ICD-10-CM

## 2018-11-12 MED ORDER — ESTRADIOL 1 MG PO TABS: 1.0000 mg | ORAL_TABLET | Freq: Every day | ORAL | 4 refills | Status: DC

## 2018-11-12 NOTE — Telephone Encounter (Signed)
-----   Message from Princess Bruins, MD sent at 11/12/2018 12:39 PM EDT ----- Regarding: Refer for Left Dx mammo/US Left breast lump at 8 O'clock x 1 week.  1 x 1 cm mobile, round.

## 2018-11-12 NOTE — Progress Notes (Signed)
Pamela Forbes May 04, 1965 [id number]   History:    53 y.o.  G2P2L2 Married. Oldest, transgender son is in art in [place].  Pamela Forbes, daughter in theatre in [place].  RP: Established patient presenting for annual gyn exam   HPI: S/P Hysterectomy/RSO.  Postmenopause, well on Estradiol patch 0.1, but would like to switch to a tablet form because will go in hot tub frequently.  No pelvic pain.  Urine/BMs wnl.  Breasts Rt normal, Lt with a small lump x 1 week. Body mass index 33.44.  Fasting health labs with family physician.  No colonoscopy done yet.  Past medical history,surgical history, family history and social history were all reviewed and documented in the EPIC chart.  Gynecologic History No LMP recorded. Patient has had a hysterectomy. Contraception: status post hysterectomy Last Pap: 08/2017. Results were: Negative Last mammogram: 12/2017. Results were: Negative Bone Density: Never Colonoscopy: Never, will schedule now.  Obstetric History OB History  Gravida Para Term Preterm AB Living  2 2 2     2   SAB TAB Ectopic Multiple Live Births               # Outcome Date GA Lbr Len/2nd Weight Sex Delivery Anes PTL Lv  2 Term           1 Term              ROS: A ROS was performed and pertinent positives and negatives are included in the history.  GENERAL: No fevers or chills. HEENT: No change in vision, no earache, sore throat or sinus congestion. NECK: No pain or stiffness. CARDIOVASCULAR: No chest pain or pressure. No palpitations. PULMONARY: No shortness of breath, cough or wheeze. GASTROINTESTINAL: No abdominal pain, nausea, vomiting or diarrhea, melena or bright red blood per rectum. GENITOURINARY: No urinary frequency, urgency, hesitancy or dysuria. MUSCULOSKELETAL: No joint or muscle pain, no back pain, no recent trauma. DERMATOLOGIC: No rash, no itching, no lesions. ENDOCRINE: No polyuria, polydipsia, no heat or cold intolerance. No recent change in weight. HEMATOLOGICAL: No  anemia or easy bruising or bleeding. NEUROLOGIC: No headache, seizures, numbness, tingling or weakness. PSYCHIATRIC: No depression, no loss of interest in normal activity or change in sleep pattern.     Exam:   BP 130/80   Ht 5' 3.5" (1.613 m)   Wt 191 lb 12.8 oz (87 kg)   BMI 33.44 kg/m   Body mass index is 33.44 kg/m.  General appearance : Well developed well nourished female. No acute distress HEENT: Eyes: no retinal hemorrhage or exudates,  Neck supple, trachea midline, no carotid bruits, no thyroidmegaly Lungs: Clear to auscultation, no rhonchi or wheezes, or rib retractions  Heart: Regular rate and rhythm, no murmurs or gallops Breast:Examined in sitting and supine position were symmetrical in appearance, no palpable masses or tenderness,  no skin retraction, no nipple inversion, no nipple discharge, no skin discoloration, no axillary or supraclavicular lymphadenopathy Abdomen: no palpable masses or tenderness, no rebound or guarding Extremities: no edema or skin discoloration or tenderness  Pelvic: Vulva: Normal             Vagina: No gross lesions or discharge  Cervix/Uterus absent  Adnexa  Without masses or tenderness  Anus: Normal   Assessment/Plan:  53 y.o. female for annual exam   1. Well female exam with routine gynecological exam Gynecologic exam status post total hysterectomy and menopause.  Pap test August 2019 was negative, no indication to repeat this year.  Breast  exam normal on the right and with a small nodule on the left.  Will do a screening mammogram for the right breast in December 2020 and will organize a left diagnostic mammogram and ultrasound now.  Health labs with family physician.  Will organize a screening colonoscopy now.  2. S/P total hysterectomy  3. Postmenopausal hormone replacement therapy Well on the hormone replacement therapy.  Status post total hysterectomy.  No contraindication to continue on estrogen.  Requests a change from patch to  tablets because will be using a hot tub frequently.  Estradiol 1 mg per mouth daily prescribed.  Usage reviewed and prescription sent to pharmacy.  4. Breast lump on left side at 8 o'clock position Left breast lump x1 week.  On exam the nodule is 1 x 1 cm, mobile, round, nontender.  It is located at 8:00.  We will proceed with a left diagnostic mammogram and ultrasound.  5. Class 1 obesity due to excess calories without serious comorbidity with body mass index (BMI) of 33.0 to 33.9 in adult Recommend a lower calorie/carb diet such as [employer].  Aerobic physical activities 5 times a week and weightlifting every 2 days.  Other orders - atorvastatin (LIPITOR) 10 MG tablet; Take 10 mg by mouth daily. - fenofibrate (TRICOR) 48 MG tablet; Take 48 mg by mouth daily. - estradiol (ESTRACE) 1 MG tablet; Take 1 tablet (1 mg total) by mouth daily.  Pamela Bruins MD, 12:15 PM 11/12/2018

## 2018-11-12 NOTE — Patient Instructions (Signed)
1. Well female exam with routine gynecological exam Gynecologic exam status post total hysterectomy and menopause.  Pap test August 2019 was negative, no indication to repeat this year.  Breast exam normal on the right and with a small nodule on the left.  Will do a screening mammogram for the right breast in December 2020 and will organize a left diagnostic mammogram and ultrasound now.  Health labs with family physician.  Will organize a screening colonoscopy now.  2. S/P total hysterectomy  3. Postmenopausal hormone replacement therapy Well on the hormone replacement therapy.  Status post total hysterectomy.  No contraindication to continue on estrogen.  Requests a change from patch to tablets because will be using a hot tub frequently.  Estradiol 1 mg per mouth daily prescribed.  Usage reviewed and prescription sent to pharmacy.  4. Breast lump on left side at 8 o'clock position Left breast lump x1 week.  On exam the nodule is 1 x 1 cm, mobile, round, nontender.  It is located at 8:00.  We will proceed with a left diagnostic mammogram and ultrasound.  5. Class 1 obesity due to excess calories without serious comorbidity with body mass index (BMI) of 33.0 to 33.9 in adult Recommend a lower calorie/carb diet such as [employer].  Aerobic physical activities 5 times a week and weightlifting every 2 days.  Other orders - atorvastatin (LIPITOR) 10 MG tablet; Take 10 mg by mouth daily. - fenofibrate (TRICOR) 48 MG tablet; Take 48 mg by mouth daily. - estradiol (ESTRACE) 1 MG tablet; Take 1 tablet (1 mg total) by mouth daily.  [name], it was a pleasure seeing you today!

## 2018-11-13 NOTE — Telephone Encounter (Signed)
Patient scheduled on 11/21/18 @ 12:40 at breast center, patient scheduled by breast center

## 2018-11-14 ENCOUNTER — Encounter: Payer: Self-pay | Admitting: *Deleted

## 2018-11-21 ENCOUNTER — Other Ambulatory Visit: Payer: Self-pay

## 2018-11-21 ENCOUNTER — Ambulatory Visit
Admission: RE | Admit: 2018-11-21 | Discharge: 2018-11-21 | Disposition: A | Discharge status: Home / Self Care | Payer: No Typology Code available for payment source | Source: Ambulatory Visit | Attending: Obstetrics & Gynecology | Admitting: Obstetrics & Gynecology

## 2018-11-21 DIAGNOSIS — N6324 Unspecified lump in the left breast, lower inner quadrant: Secondary | ICD-10-CM

## 2018-11-24 ENCOUNTER — Other Ambulatory Visit: Payer: Self-pay | Admitting: Obstetrics & Gynecology

## 2019-01-28 ENCOUNTER — Ambulatory Visit
Admission: RE | Admit: 2019-01-28 | Discharge: 2019-01-28 | Disposition: A | Discharge status: Home / Self Care | Payer: No Typology Code available for payment source | Source: Ambulatory Visit

## 2019-01-28 ENCOUNTER — Other Ambulatory Visit: Payer: Self-pay

## 2019-01-28 ENCOUNTER — Other Ambulatory Visit: Payer: Self-pay | Admitting: Family Medicine

## 2019-01-28 DIAGNOSIS — Z1231 Encounter for screening mammogram for malignant neoplasm of breast: Secondary | ICD-10-CM

## 2019-07-01 ENCOUNTER — Encounter: Payer: Self-pay | Admitting: Gastroenterology

## 2019-09-05 ENCOUNTER — Ambulatory Visit (AMBULATORY_SURGERY_CENTER): Payer: Self-pay | Admitting: *Deleted

## 2019-09-05 ENCOUNTER — Other Ambulatory Visit: Payer: Self-pay

## 2019-09-05 VITALS — Ht 63.5 in | Wt 186.0 lb

## 2019-09-05 DIAGNOSIS — Z1211 Encounter for screening for malignant neoplasm of colon: Secondary | ICD-10-CM

## 2019-09-05 MED ORDER — SUTAB 1479-225-188 MG PO TABS: 1.0000 | ORAL_TABLET | ORAL | 0 refills | Status: DC

## 2019-09-05 NOTE — Progress Notes (Signed)
Patient is here in-person for PV. Patient denies any allergies to eggs or soy. Patient denies any problems with anesthesia/sedation. Patient denies any oxygen use at home. Patient denies taking any diet/weight loss medications or blood thinners. Patient is not being treated for MRSA or C-diff. Patient is aware of our care-partner policy and [facility]-11 safety protocol. EMMI education assisgned to the patient for the procedure, this was explained and instructions given to patient.   COVID-19 vaccines completed. Prep Prescription coupon given to the patient.

## 2019-09-15 ENCOUNTER — Encounter: Payer: No Typology Code available for payment source | Admitting: Gastroenterology

## 2019-09-17 ENCOUNTER — Ambulatory Visit (AMBULATORY_SURGERY_CENTER): Payer: No Typology Code available for payment source | Admitting: Gastroenterology

## 2019-09-17 ENCOUNTER — Encounter: Payer: Self-pay | Admitting: Gastroenterology

## 2019-09-17 ENCOUNTER — Other Ambulatory Visit: Payer: Self-pay

## 2019-09-17 VITALS — BP 133/75 | HR 77 | Temp 96.8°F | Resp 16 | Ht 63.5 in | Wt 186.0 lb

## 2019-09-17 DIAGNOSIS — Z800 Family history of malignant neoplasm of digestive organs: Secondary | ICD-10-CM | POA: Diagnosis not present

## 2019-09-17 DIAGNOSIS — Z1211 Encounter for screening for malignant neoplasm of colon: Secondary | ICD-10-CM

## 2019-09-17 DIAGNOSIS — K635 Polyp of colon: Secondary | ICD-10-CM | POA: Diagnosis not present

## 2019-09-17 DIAGNOSIS — D122 Benign neoplasm of ascending colon: Secondary | ICD-10-CM

## 2019-09-17 MED ORDER — SODIUM CHLORIDE 0.9 % IV SOLN: 500.0000 mL | Freq: Once | INTRAVENOUS | Status: DC

## 2019-09-17 NOTE — Patient Instructions (Signed)
Handouts given for polyps and diverticulosis.  Await pathology results.  YOU HAD AN ENDOSCOPIC PROCEDURE TODAY AT THE [facility]:   Refer to the procedure report that was given to you for any specific questions about what was found during the examination.  If the procedure report does not answer your questions, please call your gastroenterologist to clarify.  If you requested that your care partner not be given the details of your procedure findings, then the procedure report has been included in a sealed envelope for you to review at your convenience later.  YOU SHOULD EXPECT: Some feelings of bloating in the abdomen. Passage of more gas than usual.  Walking can help get rid of the air that was put into your GI tract during the procedure and reduce the bloating. If you had a lower endoscopy (such as a colonoscopy or flexible sigmoidoscopy) you may notice spotting of blood in your stool or on the toilet paper. If you underwent a bowel prep for your procedure, you may not have a normal bowel movement for a few days.  Please Note:  You might notice some irritation and congestion in your nose or some drainage.  This is from the oxygen used during your procedure.  There is no need for concern and it should clear up in a day or so.  SYMPTOMS TO REPORT IMMEDIATELY:   Following lower endoscopy (colonoscopy or flexible sigmoidoscopy):  Excessive amounts of blood in the stool  Significant tenderness or worsening of abdominal pains  Swelling of the abdomen that is new, acute  Fever of 100F or higher  For urgent or emergent issues, a gastroenterologist can be reached at any hour by calling [phone]. Do not use MyChart messaging for urgent concerns.    DIET:  We do recommend a small meal at first, but then you may proceed to your regular diet.  Drink plenty of fluids but you should avoid alcoholic beverages for 24 hours.  ACTIVITY:  You should plan to take it easy for the rest of  today and you should NOT DRIVE or use heavy machinery until tomorrow (because of the sedation medicines used during the test).    FOLLOW UP: Our staff will call the number listed on your records 48-72 hours following your procedure to check on you and address any questions or concerns that you may have regarding the information given to you following your procedure. If we do not reach you, we will leave a message.  We will attempt to reach you two times.  During this call, we will ask if you have developed any symptoms of COVID 19. If you develop any symptoms (ie: fever, flu-like symptoms, shortness of breath, cough etc.) before then, please call [phone].  If you test positive for Covid 19 in the 2 weeks post procedure, please call and report this information to us.    If any biopsies were taken you will be contacted by phone or by letter within the next 1-3 weeks.  Please call us at [phone] if you have not heard about the biopsies in 3 weeks.    SIGNATURES/CONFIDENTIALITY: You and/or your care partner have signed paperwork which will be entered into your electronic medical record.  These signatures attest to the fact that that the information above on your After Visit Summary has been reviewed and is understood.  Full responsibility of the confidentiality of this discharge information lies with you and/or your care-partner. 

## 2019-09-17 NOTE — Op Note (Signed)
[facility] Patient Name: Pamela Forbes Procedure Date: 09/17/2019 3:11 PM MRN: [id number] Endoscopist: Thornton Park MD, MD Age: 54 Referring MD:  Date of Birth: September 08, 1965 Gender: Female Account #: [id number] Procedure:                Colonoscopy Indications:              Screening for colorectal malignant neoplasm, This                            is the patient's first colonoscopy                           No known family history of colon cancer or polyps Medicines:                Monitored Anesthesia Care Procedure:                Pre-Anesthesia Assessment:                           - Prior to the procedure, a History and Physical                            was performed, and patient medications and                            allergies were reviewed. The patient's tolerance of                            previous anesthesia was also reviewed. The risks                            and benefits of the procedure and the sedation                            options and risks were discussed with the patient.                            All questions were answered, and informed consent                            was obtained. Prior Anticoagulants: The patient has                            taken no previous anticoagulant or antiplatelet                            agents. ASA Grade Assessment: II - A patient with                            mild systemic disease. After reviewing the risks                            and benefits, the patient was deemed in  satisfactory condition to undergo the procedure.                           After obtaining informed consent, the colonoscope                            was passed under direct vision. Throughout the                            procedure, the patient's blood pressure, pulse, and                            oxygen saturations were monitored continuously. The                            Colonoscope was  introduced through the anus and                            advanced to the 3 cm into the ileum. A second                            forward view of the right colon was performed. The                            colonoscopy was performed without difficulty. The                            patient tolerated the procedure well. The quality                            of the bowel preparation was good. The terminal                            ileum, ileocecal valve, appendiceal orifice, and                            rectum were photographed. Scope In: 3:23:50 PM Scope Out: 3:38:45 PM Scope Withdrawal Time: 0 hours 10 minutes 58 seconds  Total Procedure Duration: 0 hours 14 minutes 55 seconds  Findings:                 The perianal and digital rectal examinations were                            normal.                           A few small-mouthed diverticula were found in the                            sigmoid colon.                           A less than 1 mm polyp was found in the distal  ascending colon. The polyp was flat. The polyp was                            removed with a cold biopsy forceps. Resection and                            retrieval were complete. Estimated blood loss was                            minimal.                           The exam was otherwise without abnormality on                            direct and retroflexion views. Complications:            No immediate complications. Estimated blood loss:                            Minimal. Estimated Blood Loss:     Estimated blood loss was minimal. Impression:               - Diverticulosis in the sigmoid colon.                           - One less than 1 mm polyp in the distal ascending                            colon, removed with a cold biopsy forceps. Resected                            and retrieved.                           - The examination was otherwise normal on direct                             and retroflexion views. Recommendation:           - Patient has a contact number available for                            emergencies. The signs and symptoms of potential                            delayed complications were discussed with the                            patient. Return to normal activities tomorrow.                            Written discharge instructions were provided to the                            patient.                           -  Follow a high fiber diet. Drink at least 64                            ounces of water daily. Add a daily stool bulking                            agent such as psyllium (an exampled would be                            Metamucil).                           - Continue present medications.                           - Await pathology results.                           - Repeat colonoscopy date to be determined after                            pending pathology results are reviewed for                            surveillance.                           - Emerging evidence supports eating a diet of                            fruits, vegetables, grains, calcium, and yogurt                            while reducing red meat and alcohol may reduce the                            risk of colon cancer.                           - Thank you for allowing me to be involved in your                            colon cancer prevention. Thornton Park MD, MD 09/17/2019 3:43:20 PM This report has been signed electronically.

## 2019-09-17 NOTE — Progress Notes (Signed)
[facility]  Pt's states no medical or surgical changes since previsit or office visit.  

## 2019-09-17 NOTE — Progress Notes (Signed)
pt tolerated well. VSS. awake and to recovery. Report given to RN.  

## 2019-09-19 ENCOUNTER — Telehealth: Payer: Self-pay | Admitting: *Deleted

## 2019-09-19 NOTE — Telephone Encounter (Signed)
  Follow up Call-  Call back number 09/17/2019  Post procedure Call Back phone  # [phone]  Permission to leave phone message Yes  Some recent data might be hidden     Patient questions:  Do you have a fever, pain , or abdominal swelling? No. Pain Score  0 *  Have you tolerated food without any problems? Yes.    Have you been able to return to your normal activities? Yes.    Do you have any questions about your discharge instructions: Diet   No. Medications  No. Follow up visit  No.  Do you have questions or concerns about your Care? No.  Actions: * If pain score is 4 or above: 1. No action needed, pain <4.Have you developed a fever since your procedure? no  2.   Have you had an respiratory symptoms (SOB or cough) since your procedure? no  3.   Have you tested positive for COVID 19 since your procedure no  4.   Have you had any family members/close contacts diagnosed with the COVID 19 since your procedure?  no   If yes to any of these questions please route to Joylene John, RN and Joella Prince, RN

## 2019-09-25 ENCOUNTER — Encounter: Payer: Self-pay | Admitting: Gastroenterology

## 2019-11-13 ENCOUNTER — Ambulatory Visit: Payer: No Typology Code available for payment source | Admitting: Obstetrics & Gynecology

## 2019-11-13 ENCOUNTER — Encounter: Payer: Self-pay | Admitting: Obstetrics & Gynecology

## 2019-11-13 ENCOUNTER — Other Ambulatory Visit: Payer: Self-pay

## 2019-11-13 VITALS — BP 128/76 | Ht 63.75 in | Wt 184.0 lb

## 2019-11-13 DIAGNOSIS — Z79890 Hormone replacement therapy (postmenopausal): Secondary | ICD-10-CM | POA: Diagnosis not present

## 2019-11-13 DIAGNOSIS — E6609 Other obesity due to excess calories: Secondary | ICD-10-CM | POA: Diagnosis not present

## 2019-11-13 DIAGNOSIS — E66811 Obesity, class 1: Secondary | ICD-10-CM

## 2019-11-13 DIAGNOSIS — Z6831 Body mass index (BMI) 31.0-31.9, adult: Secondary | ICD-10-CM

## 2019-11-13 DIAGNOSIS — Z01419 Encounter for gynecological examination (general) (routine) without abnormal findings: Secondary | ICD-10-CM

## 2019-11-13 DIAGNOSIS — Z90710 Acquired absence of both cervix and uterus: Secondary | ICD-10-CM

## 2019-11-13 MED ORDER — ESTRADIOL 1 MG PO TABS: 1.0000 mg | ORAL_TABLET | Freq: Every day | ORAL | 4 refills | Status: DC

## 2019-11-13 NOTE — Progress Notes (Signed)
Pamela Forbes 1965/04/11 [id number]   History:    54 y.o. G2P2L2 Married. Oldest, transgender son is in art in [place]. Pamela Forbes, daughter in theatre in [place].  CV:[id number] presenting for annual gyn exam   HPI:S/P Hysterectomy/RSO.  Postmenopause, well on Estradiol tab 1 mg PO daily. No pelvic pain. Urine/BMs wnl. Breasts Normal. Body mass index 31.83. Followed by Nutritionist.  Exercising regularly.  Fasting health labs with family physician. Colono 08/2019.  Past medical history,surgical history, family history and social history were all reviewed and documented in the EPIC chart.  Gynecologic History No LMP recorded. Patient has had a hysterectomy.  Obstetric History OB History  Gravida Para Term Preterm AB Living  2 2 2     2   SAB TAB Ectopic Multiple Live Births               # Outcome Date GA Lbr Len/2nd Weight Sex Delivery Anes PTL Lv  2 Term           1 Term              ROS: A ROS was performed and pertinent positives and negatives are included in the history.  GENERAL: No fevers or chills. HEENT: No change in vision, no earache, sore throat or sinus congestion. NECK: No pain or stiffness. CARDIOVASCULAR: No chest pain or pressure. No palpitations. PULMONARY: No shortness of breath, cough or wheeze. GASTROINTESTINAL: No abdominal pain, nausea, vomiting or diarrhea, melena or bright red blood per rectum. GENITOURINARY: No urinary frequency, urgency, hesitancy or dysuria. MUSCULOSKELETAL: No joint or muscle pain, no back pain, no recent trauma. DERMATOLOGIC: No rash, no itching, no lesions. ENDOCRINE: No polyuria, polydipsia, no heat or cold intolerance. No recent change in weight. HEMATOLOGICAL: No anemia or easy bruising or bleeding. NEUROLOGIC: No headache, seizures, numbness, tingling or weakness. PSYCHIATRIC: No depression, no loss of interest in normal activity or change in sleep pattern.     Exam:   BP 128/76   Ht 5' 3.75" (1.619 m)   Wt 184  lb (83.5 kg)   BMI 31.83 kg/m   Body mass index is 31.83 kg/m.  General appearance : Well developed well nourished female. No acute distress HEENT: Eyes: no retinal hemorrhage or exudates,  Neck supple, trachea midline, no carotid bruits, no thyroidmegaly Lungs: Clear to auscultation, no rhonchi or wheezes, or rib retractions  Heart: Regular rate and rhythm, no murmurs or gallops Breast:Examined in sitting and supine position were symmetrical in appearance, no palpable masses or tenderness,  no skin retraction, no nipple inversion, no nipple discharge, no skin discoloration, no axillary or supraclavicular lymphadenopathy Abdomen: no palpable masses or tenderness, no rebound or guarding Extremities: no edema or skin discoloration or tenderness  Pelvic: Vulva: Normal             Vagina: No gross lesions or discharge  Cervix/Uterus absent  Adnexa  Without masses or tenderness  Anus: Normal   Assessment/Plan:  54 y.o. female for annual exam   1. Well female exam with routine gynecological exam Gynecologic exam status post total hysterectomy with right salpingo-oophorectomy.  No indication to repeat a Pap test this year.  Breast exam normal.  Last screening mammogram January 2021 was negative.  Colonoscopy in August 2021.  Health labs with family physician.  2. S/P total hysterectomy  3. Postmenopausal hormone replacement therapy Well on estradiol 1 mg per mouth daily.  No contraindication to continue on hormone replacement therapy.  Status post total hysterectomy.  Prescription sent to pharmacy.  Vitamin D supplements, calcium intake of 1500 mg daily and regular weightbearing physical activity is recommended.  4. Class 1 obesity due to excess calories without serious comorbidity with body mass index (BMI) of 31.0 to 31.9 in adult Followed by nutritionist on a low calorie/carb nutrition plan.  Aerobic activities 5 times a week and light weightlifting every 2 days recommended.  Other  orders - estradiol (ESTRACE) 1 MG tablet; Take 1 tablet (1 mg total) by mouth daily.  Princess Bruins MD, 11:10 AM 11/13/2019

## 2019-11-16 ENCOUNTER — Encounter: Payer: Self-pay | Admitting: Obstetrics & Gynecology

## 2020-06-14 ENCOUNTER — Other Ambulatory Visit: Payer: Self-pay | Admitting: Family Medicine

## 2020-06-14 DIAGNOSIS — Z1231 Encounter for screening mammogram for malignant neoplasm of breast: Secondary | ICD-10-CM

## 2020-06-17 ENCOUNTER — Ambulatory Visit
Admission: RE | Admit: 2020-06-17 | Discharge: 2020-06-17 | Disposition: A | Discharge status: Home / Self Care | Payer: No Typology Code available for payment source | Source: Ambulatory Visit

## 2020-06-17 ENCOUNTER — Other Ambulatory Visit: Payer: Self-pay

## 2020-06-17 DIAGNOSIS — Z1231 Encounter for screening mammogram for malignant neoplasm of breast: Secondary | ICD-10-CM

## 2020-11-17 ENCOUNTER — Ambulatory Visit: Payer: Self-pay | Admitting: Obstetrics & Gynecology

## 2021-02-02 ENCOUNTER — Other Ambulatory Visit: Payer: Self-pay | Admitting: Obstetrics & Gynecology

## 2021-02-02 DIAGNOSIS — Z1231 Encounter for screening mammogram for malignant neoplasm of breast: Secondary | ICD-10-CM

## 2021-04-14 ENCOUNTER — Ambulatory Visit: Payer: No Typology Code available for payment source | Admitting: Obstetrics & Gynecology

## 2021-06-28 ENCOUNTER — Ambulatory Visit
Admission: RE | Admit: 2021-06-28 | Discharge: 2021-06-28 | Disposition: A | Discharge status: Home / Self Care | Payer: No Typology Code available for payment source | Source: Ambulatory Visit

## 2021-06-28 DIAGNOSIS — Z1231 Encounter for screening mammogram for malignant neoplasm of breast: Secondary | ICD-10-CM

## 2022-06-08 ENCOUNTER — Ambulatory Visit: Payer: No Typology Code available for payment source | Admitting: Obstetrics & Gynecology

## 2022-06-16 ENCOUNTER — Other Ambulatory Visit (HOSPITAL_COMMUNITY)
Admission: RE | Admit: 2022-06-16 | Discharge: 2022-06-16 | Disposition: A | Discharge status: Home / Self Care | Payer: 59 | Source: Ambulatory Visit | Attending: Obstetrics & Gynecology | Admitting: Obstetrics & Gynecology

## 2022-06-16 ENCOUNTER — Ambulatory Visit (INDEPENDENT_AMBULATORY_CARE_PROVIDER_SITE_OTHER): Payer: 59 | Admitting: Obstetrics & Gynecology

## 2022-06-16 ENCOUNTER — Encounter: Payer: Self-pay | Admitting: Obstetrics & Gynecology

## 2022-06-16 VITALS — BP 114/80 | HR 86 | Ht 63.25 in | Wt 163.0 lb

## 2022-06-16 DIAGNOSIS — Z79890 Hormone replacement therapy (postmenopausal): Secondary | ICD-10-CM

## 2022-06-16 DIAGNOSIS — Z90710 Acquired absence of both cervix and uterus: Secondary | ICD-10-CM

## 2022-06-16 DIAGNOSIS — Z1272 Encounter for screening for malignant neoplasm of vagina: Secondary | ICD-10-CM

## 2022-06-16 DIAGNOSIS — Z01419 Encounter for gynecological examination (general) (routine) without abnormal findings: Secondary | ICD-10-CM | POA: Diagnosis not present

## 2022-06-16 MED ORDER — ESTRADIOL 0.5 MG PO TABS: 0.2500 mg | ORAL_TABLET | Freq: Every day | ORAL | 4 refills | Status: AC

## 2022-06-16 NOTE — Progress Notes (Signed)
Pamela Forbes 04/03/1965 [id number]   History:    57 y.o.  G2P2L2 Married. Oldest, transgender son is in art in [place].  Corliss [place], daughter in theatre in [place].   RP: Established patient presenting for annual gyn exam    HPI: S/P Hysterectomy/RSO.  Postmenopause, well on Estradiol 0.5 mg/tab 1/2 tab PO daily.  No pelvic pain. Pap Neg 08/2017. Pap reflex today.  Urine/BMs wnl. Breasts Normal. Mammo Neg 06/2021. Body mass index improved to 28.65.  Followed by Nutritionist.  Exercising regularly.  Fasting health labs with family physician.  Colono 08/2019.   Past medical history,surgical history, family history and social history were all reviewed and documented in the EPIC chart.  Gynecologic History No LMP recorded. Patient has had a hysterectomy.  Obstetric History OB History  Gravida Para Term Preterm AB Living  2 2 2     2   SAB IAB Ectopic Multiple Live Births               # Outcome Date GA Lbr Len/2nd Weight Sex Delivery Anes PTL Lv  2 Term           1 Term              ROS: A ROS was performed and pertinent positives and negatives are included in the history. GENERAL: No fevers or chills. HEENT: No change in vision, no earache, sore throat or sinus congestion. NECK: No pain or stiffness. CARDIOVASCULAR: No chest pain or pressure. No palpitations. PULMONARY: No shortness of breath, cough or wheeze. GASTROINTESTINAL: No abdominal pain, nausea, vomiting or diarrhea, melena or bright red blood per rectum. GENITOURINARY: No urinary frequency, urgency, hesitancy or dysuria. MUSCULOSKELETAL: No joint or muscle pain, no back pain, no recent trauma. DERMATOLOGIC: No rash, no itching, no lesions. ENDOCRINE: No polyuria, polydipsia, no heat or cold intolerance. No recent change in weight. HEMATOLOGICAL: No anemia or easy bruising or bleeding. NEUROLOGIC: No headache, seizures, numbness, tingling or weakness. PSYCHIATRIC: No depression, no loss of interest in normal activity or change in sleep  pattern.     Exam:   BP 114/80   Pulse 86   Ht 5' 3.25" (1.607 m)   Wt 163 lb (73.9 kg)   SpO2 98%   BMI 28.65 kg/m   Body mass index is 28.65 kg/m.  General appearance : Well developed well nourished female. No acute distress HEENT: Eyes: no retinal hemorrhage or exudates,  Neck supple, trachea midline, no carotid bruits, no thyroidmegaly Lungs: Clear to auscultation, no rhonchi or wheezes, or rib retractions  Heart: Regular rate and rhythm, no murmurs or gallops Breast:Examined in sitting and supine position were symmetrical in appearance, no palpable masses or tenderness,  no skin retraction, no nipple inversion, no nipple discharge, no skin discoloration, no axillary or supraclavicular lymphadenopathy Abdomen: no palpable masses or tenderness, no rebound or guarding Extremities: no edema or skin discoloration or tenderness  Pelvic: Vulva: Normal             Vagina: No gross lesions or discharge.  Pap reflex done.  Cervix/Uterus absent  Adnexa  Without masses or tenderness  Anus: Normal   Assessment/Plan:  57 y.o. female for annual exam   1. Encounter for Papanicolaou smear of vagina as part of routine gynecological examination S/P Hysterectomy/RSO.  Postmenopause, well on Estradiol 0.5 mg/tab 1/2 tab PO daily.  No pelvic pain. Pap Neg 08/2017. Pap reflex today.  Urine/BMs wnl. Breasts Normal. Mammo Neg 06/2021. Body mass index improved to 28.65.  Followed by Nutritionist.  Exercising regularly.  Fasting health labs with family physician.  Colono 08/2019. - Cytology - PAP( [facility])  2. S/P total hysterectomy, RSO  3. Postmenopausal hormone replacement therapy S/P Hysterectomy/RSO.  Postmenopause, well on Estradiol 0.5 mg/tab 1/2 tab PO daily.  No pelvic pain.  No CI to continue on Estradiol hormone therapy.  Prescription sent to pharmacy.  Other orders - cetirizine (ZYRTEC ALLERGY) 10 MG tablet; Take 10 mg by mouth daily. - Nutritional Supplements (ESTROVEN PO); Take  by mouth. - estradiol (ESTRACE) 0.5 MG tablet; Take 0.5 tablets (0.25 mg total) by mouth daily.   Genia Del MD, 12:11 PM

## 2022-06-22 LAB — CYTOLOGY - PAP: Diagnosis: NEGATIVE

## 2022-06-23 ENCOUNTER — Other Ambulatory Visit: Payer: Self-pay | Admitting: Obstetrics & Gynecology

## 2022-06-23 DIAGNOSIS — Z1231 Encounter for screening mammogram for malignant neoplasm of breast: Secondary | ICD-10-CM

## 2022-07-18 ENCOUNTER — Ambulatory Visit
Admission: RE | Admit: 2022-07-18 | Discharge: 2022-07-18 | Disposition: A | Discharge status: Home / Self Care | Payer: 59 | Source: Ambulatory Visit

## 2022-07-18 DIAGNOSIS — Z1231 Encounter for screening mammogram for malignant neoplasm of breast: Secondary | ICD-10-CM

## 2023-03-30 IMAGING — MG MM DIGITAL SCREENING BILAT W/ TOMO AND CAD
8 series · 8 of 24 positions shown · non-contrast
Comparison: Previous exam(s).

CLINICAL DATA: Screening.

EXAM:
DIGITAL SCREENING BILATERAL MAMMOGRAM WITH TOMOSYNTHESIS AND CAD
TECHNIQUE: Bilateral screening digital craniocaudal and mediolateral oblique
mammograms were obtained. Bilateral screening digital breast
tomosynthesis was performed. The images were evaluated with
computer-aided detection.

[R CC synth-2D]
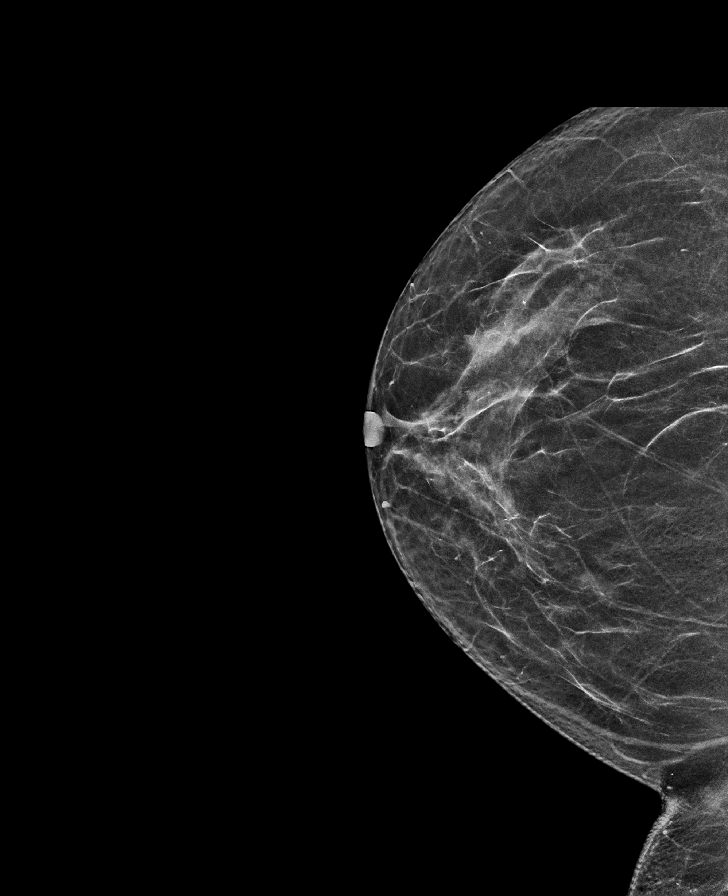

[R MLO synth-2D]
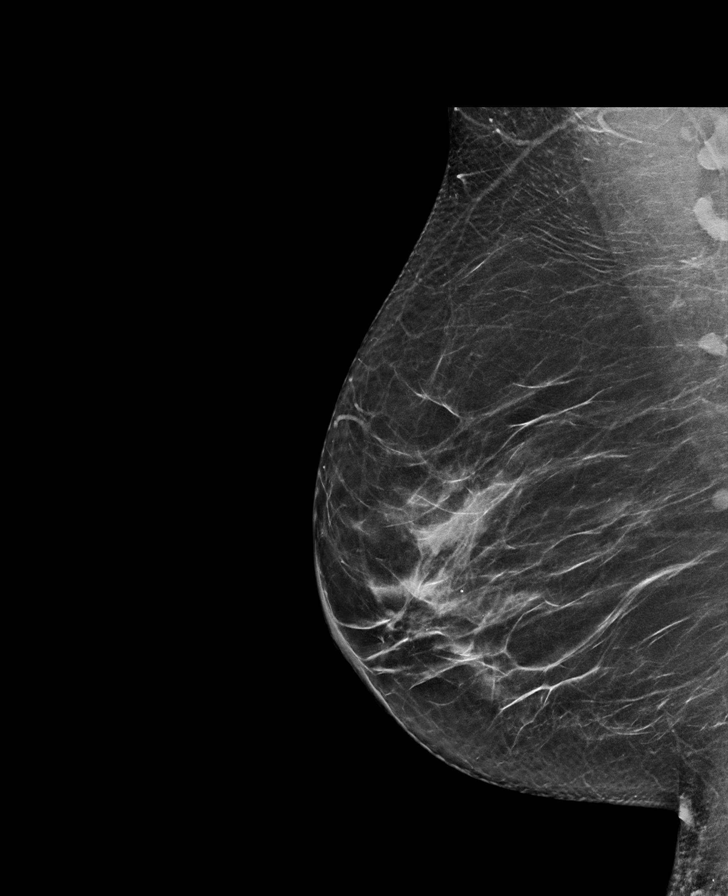

[L MLO synth-2D]
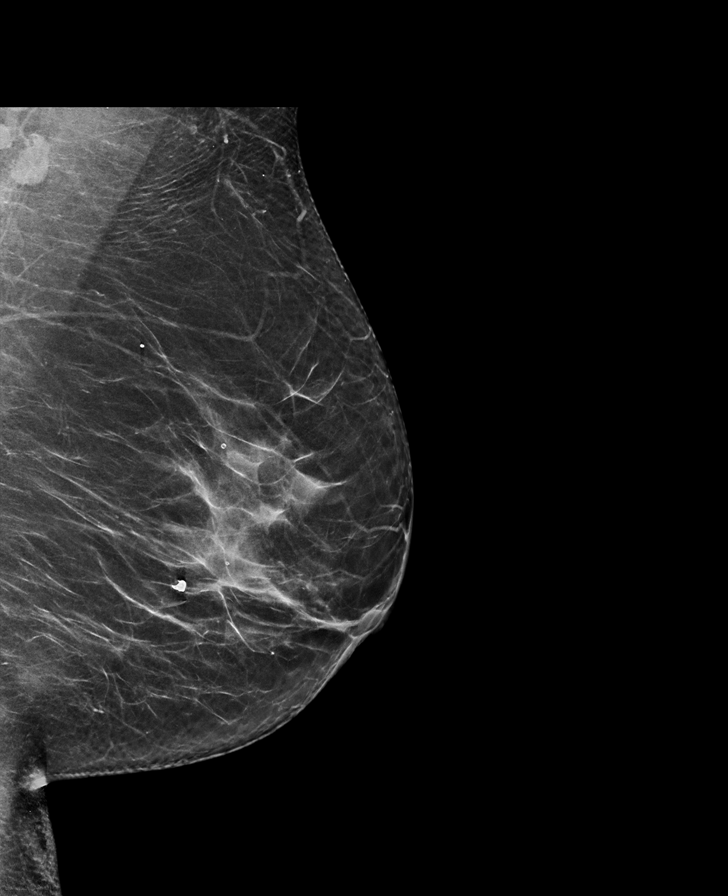

[L CC synth-2D]
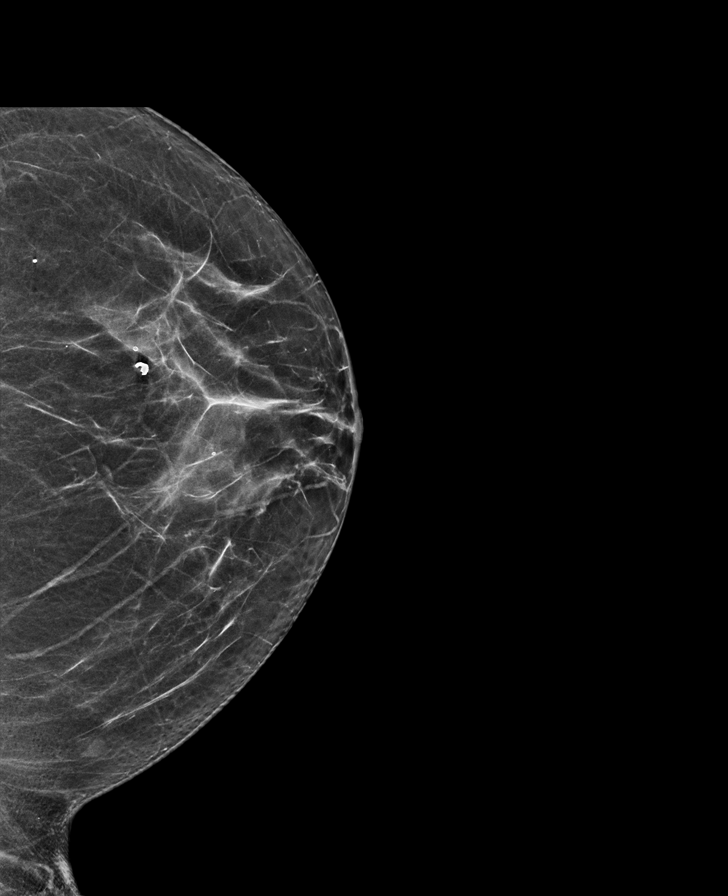

[R MLO tomo · tomo slice 35/69.0]
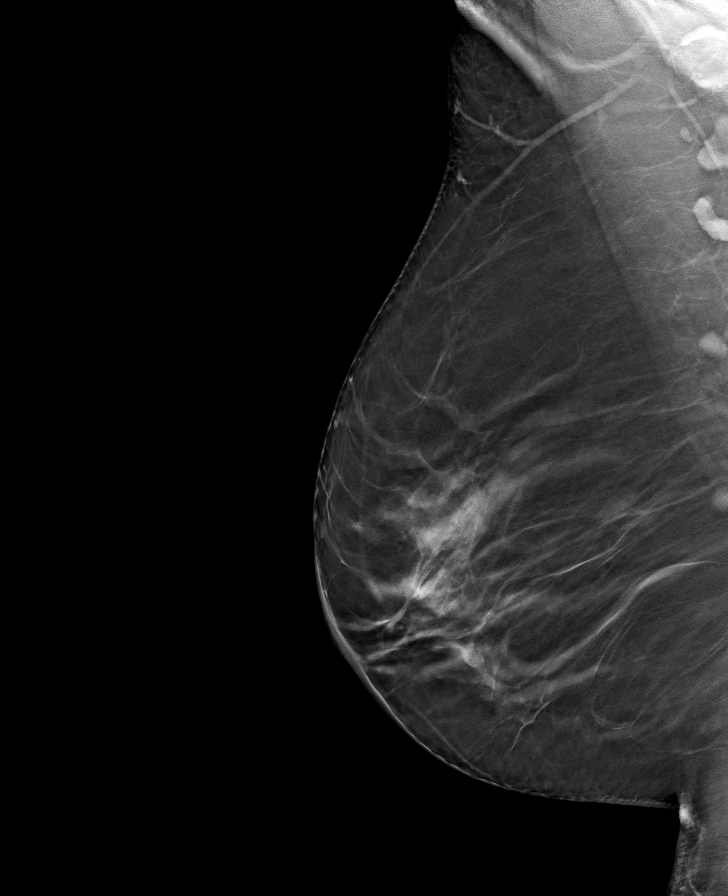

[L MLO tomo · tomo slice 35/69.0]
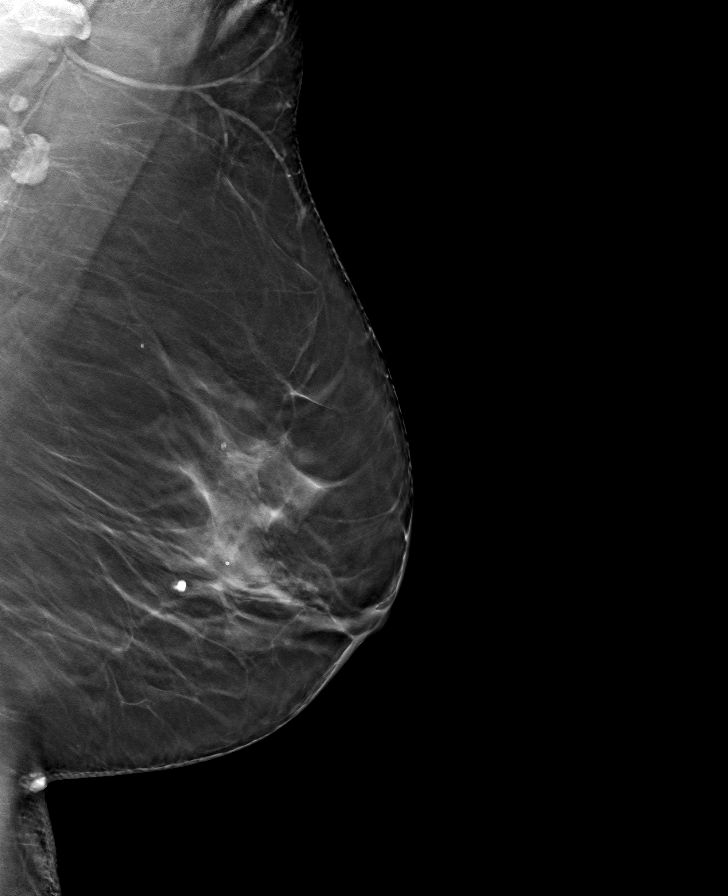

[L CC tomo · tomo slice 31/60.0]
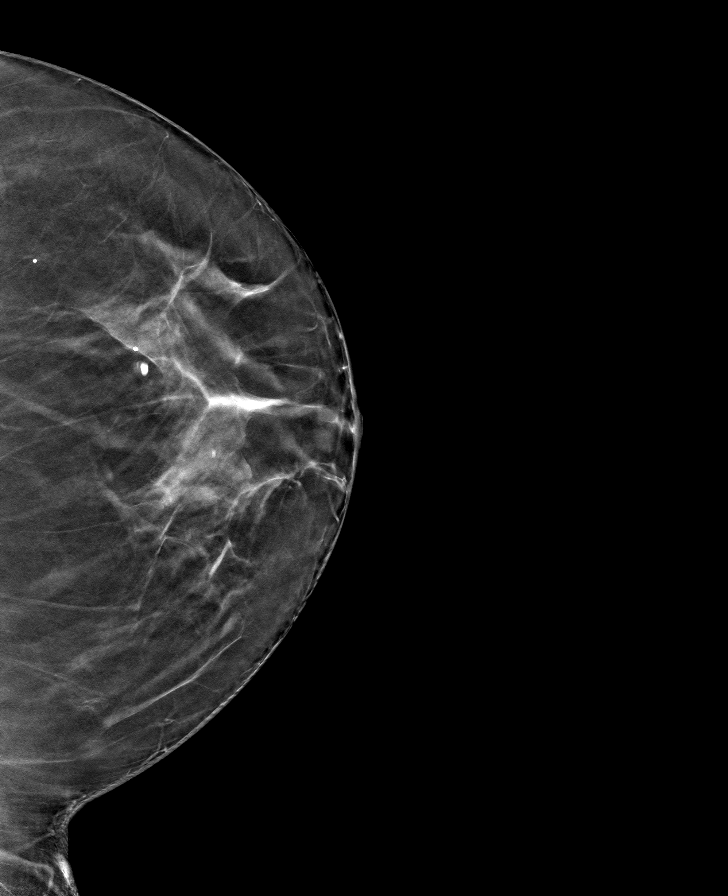

[R CC tomo · tomo slice 31/60.0]
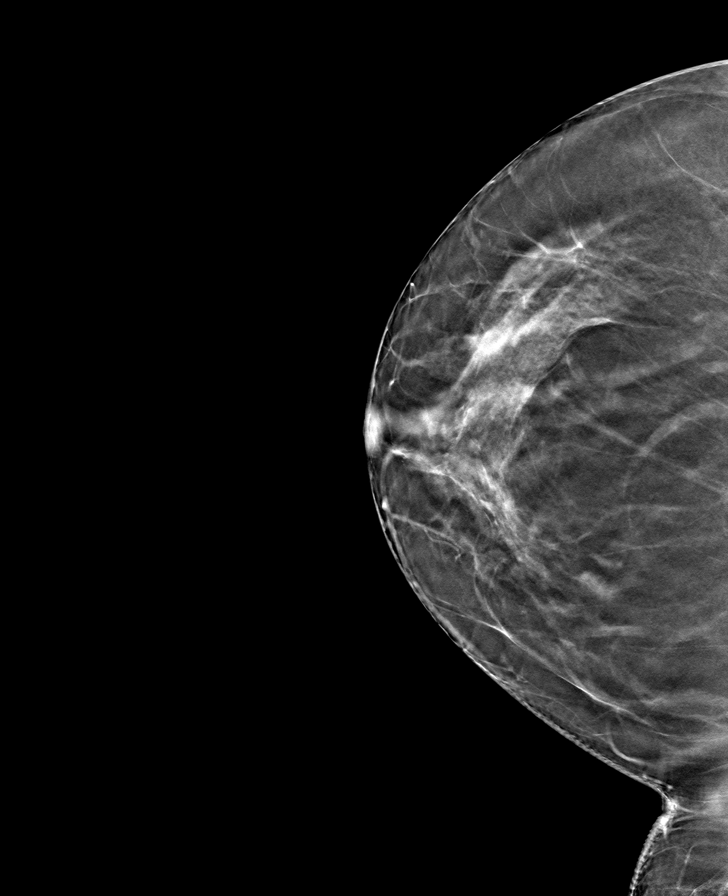

[8 of 24 positions shown; findings below may reference images not displayed]

ACR Breast Density Category b: There are scattered areas of
fibroglandular density.
FINDINGS: There are no findings suspicious for malignancy.
IMPRESSION: No mammographic evidence of malignancy. A result letter of this
screening mammogram will be mailed directly to the patient.

RECOMMENDATION:
Screening mammogram in one year. (Code:51-O-LD2)

BI-RADS CATEGORY  1: Negative.

## 2023-06-25 ENCOUNTER — Other Ambulatory Visit: Payer: Self-pay | Admitting: Family Medicine

## 2023-06-25 DIAGNOSIS — Z1231 Encounter for screening mammogram for malignant neoplasm of breast: Secondary | ICD-10-CM

## 2023-07-17 ENCOUNTER — Encounter

## 2023-07-17 DIAGNOSIS — Z1231 Encounter for screening mammogram for malignant neoplasm of breast: Secondary | ICD-10-CM

## 2023-07-24 ENCOUNTER — Ambulatory Visit
Admission: RE | Admit: 2023-07-24 | Discharge: 2023-07-24 | Disposition: A | Discharge status: Home / Self Care | Source: Ambulatory Visit

## 2023-07-24 DIAGNOSIS — Z1231 Encounter for screening mammogram for malignant neoplasm of breast: Secondary | ICD-10-CM
